# Patient Record
Sex: Male | Born: 1990 | Race: White | Hispanic: No | Marital: Single | Smoking: Former smoker
Health system: Southern US, Community
[De-identification: ages and names within clinical notes are randomized; demographics above are authoritative.]

## PROBLEM LIST (undated history)

## (undated) DIAGNOSIS — Z21 Asymptomatic human immunodeficiency virus [HIV] infection status: Secondary | ICD-10-CM

## (undated) DIAGNOSIS — I10 Essential (primary) hypertension: Secondary | ICD-10-CM

## (undated) DIAGNOSIS — B20 Human immunodeficiency virus [HIV] disease: Secondary | ICD-10-CM

## (undated) DIAGNOSIS — F419 Anxiety disorder, unspecified: Secondary | ICD-10-CM

## (undated) DIAGNOSIS — F32A Depression, unspecified: Secondary | ICD-10-CM

## (undated) HISTORY — PX: INNER EAR SURGERY: SHX679

---
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
## 2010-04-24 LAB — URINALYSIS, ROUTINE W REFLEX MICROSCOPIC
Glucose, UA: NEGATIVE mg/dL
Hgb urine dipstick: NEGATIVE
Protein, ur: NEGATIVE mg/dL
Urobilinogen, UA: 1 mg/dL (ref 0.0–1.0)
pH: 6 (ref 5.0–8.0)

## 2014-04-16 ENCOUNTER — Emergency Department (HOSPITAL_COMMUNITY)
Admission: EM | Admit: 2014-04-16 | Discharge: 2014-04-16 | Disposition: A | Discharge status: Home / Self Care | Payer: Worker's Compensation | Attending: Emergency Medicine | Admitting: Emergency Medicine

## 2014-04-16 ENCOUNTER — Encounter (HOSPITAL_COMMUNITY): Payer: Self-pay

## 2014-04-16 DIAGNOSIS — Z720 Tobacco use: Secondary | ICD-10-CM | POA: Insufficient documentation

## 2014-04-16 DIAGNOSIS — Z4801 Encounter for change or removal of surgical wound dressing: Secondary | ICD-10-CM | POA: Diagnosis not present

## 2014-04-16 DIAGNOSIS — Z5189 Encounter for other specified aftercare: Secondary | ICD-10-CM
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
## 2014-04-16 NOTE — Discharge Instructions (Signed)
Your wound appears healthy at this time.  Continue using the silvadene twice daily after washing with mild soap and water.  Keep covered.  Followup with Dr. Theresia LoKingsley at [facility] as planned in 2 days for another recheck of your injury.
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
## 2015-04-10 DIAGNOSIS — S0083XA Contusion of other part of head, initial encounter: Secondary | ICD-10-CM | POA: Diagnosis not present

## 2015-04-10 DIAGNOSIS — F17200 Nicotine dependence, unspecified, uncomplicated: Secondary | ICD-10-CM | POA: Insufficient documentation

## 2015-04-10 DIAGNOSIS — S30810A Abrasion of lower back and pelvis, initial encounter: Secondary | ICD-10-CM | POA: Insufficient documentation

## 2015-04-10 DIAGNOSIS — S40819A Abrasion of unspecified upper arm, initial encounter: Secondary | ICD-10-CM | POA: Diagnosis not present

## 2015-04-10 DIAGNOSIS — Y999 Unspecified external cause status: Secondary | ICD-10-CM | POA: Insufficient documentation

## 2015-04-10 DIAGNOSIS — Y929 Unspecified place or not applicable: Secondary | ICD-10-CM | POA: Insufficient documentation

## 2015-04-10 DIAGNOSIS — Y939 Activity, unspecified: Secondary | ICD-10-CM | POA: Diagnosis not present

## 2015-04-10 DIAGNOSIS — S0990XA Unspecified injury of head, initial encounter: Secondary | ICD-10-CM | POA: Diagnosis present

## 2015-04-11 ENCOUNTER — Emergency Department (HOSPITAL_COMMUNITY)
Admission: EM | Admit: 2015-04-11 | Discharge: 2015-04-11 | Disposition: A | Discharge status: Home / Self Care | Payer: BLUE CROSS/BLUE SHIELD | Attending: Emergency Medicine | Admitting: Emergency Medicine

## 2015-04-11 ENCOUNTER — Encounter (HOSPITAL_COMMUNITY): Payer: Self-pay

## 2015-04-11 DIAGNOSIS — T07XXXA Unspecified multiple injuries, initial encounter: Secondary | ICD-10-CM

## 2015-04-11 DIAGNOSIS — S0990XA Unspecified injury of head, initial encounter: Secondary | ICD-10-CM

## 2015-04-11 MED ORDER — ONDANSETRON 4 MG PO TBDP: 4.0000 mg | ORAL_TABLET | Freq: Three times a day (TID) | ORAL | Status: DC | PRN

## 2015-04-11 MED ORDER — IBUPROFEN 800 MG PO TABS
800.0000 mg | ORAL_TABLET | Freq: Once | ORAL | Status: AC
  Administered 2015-04-11: 800 mg via ORAL
  Filled 2015-04-11: qty 1

## 2015-04-11 MED ORDER — ONDANSETRON 4 MG PO TBDP
4.0000 mg | ORAL_TABLET | Freq: Once | ORAL | Status: AC
  Administered 2015-04-11: 4 mg via ORAL
  Filled 2015-04-11: qty 1

## 2015-04-11 MED ORDER — IBUPROFEN 800 MG PO TABS: 800.0000 mg | ORAL_TABLET | Freq: Three times a day (TID) | ORAL | Status: DC | PRN
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
## 2015-04-11 NOTE — Discharge Instructions (Signed)
Concussion, Adult  A concussion, or closed-head injury, is a brain injury caused by a direct blow to the head or by a quick and sudden movement (jolt) of the head or neck. Concussions are usually not life-threatening. Even so, the effects of a concussion can be serious. If you have had a concussion before, you are more likely to experience concussion-like symptoms after a direct blow to the head.   CAUSES  · Direct blow to the head, such as from running into another player during a soccer game, being hit in a fight, or hitting your head on a hard surface.  · A jolt of the head or neck that causes the brain to move back and forth inside the skull, such as in a car crash.  SIGNS AND SYMPTOMS  The signs of a concussion can be hard to notice. Early on, they may be missed by you, family members, and health care providers. You may look fine but act or feel differently.  Symptoms are usually temporary, but they may last for days, weeks, or even longer. Some symptoms may appear right away while others may not show up for hours or days. Every head injury is different. Symptoms include:  · Mild to moderate headaches that will not go away.  · A feeling of pressure inside your head.  · Having more trouble than usual:    Learning or remembering things you have heard.    Answering questions.    Paying attention or concentrating.    Organizing daily tasks.    Making decisions and solving problems.  · Slowness in thinking, acting or reacting, speaking, or reading.  · Getting lost or being easily confused.  · Feeling tired all the time or lacking energy (fatigued).  · Feeling drowsy.  · Sleep disturbances.    Sleeping more than usual.    Sleeping less than usual.    Trouble falling asleep.    Trouble sleeping (insomnia).  · Loss of balance or feeling lightheaded or dizzy.  · Nausea or vomiting.  · Numbness or tingling.  · Increased sensitivity to:    Sounds.    Lights.    Distractions.  · Vision problems or eyes that tire  easily.  · Diminished sense of taste or smell.  · Ringing in the ears.  · Mood changes such as feeling sad or anxious.  · Becoming easily irritated or angry for little or no reason.  · Lack of motivation.  · Seeing or hearing things other people do not see or hear (hallucinations).  DIAGNOSIS  Your health care provider can usually diagnose a concussion based on a description of your injury and symptoms. He or she will ask whether you passed out (lost consciousness) and whether you are having trouble remembering events that happened right before and during your injury.  Your evaluation might include:  · A brain scan to look for signs of injury to the brain. Even if the test shows no injury, you may still have a concussion.  · Blood tests to be sure other problems are not present.  TREATMENT  · Concussions are usually treated in an emergency department, in urgent care, or at a clinic. You may need to stay in the hospital overnight for further treatment.  · Tell your health care provider if you are taking any medicines, including prescription medicines, over-the-counter medicines, and natural remedies. Some medicines, such as blood thinners (anticoagulants) and aspirin, may increase the chance of complications. Also tell your health care   provider whether you have had alcohol or are taking illegal drugs. This information may affect treatment.  · Your health care provider will send you home with important instructions to follow.  · How fast you will recover from a concussion depends on many factors. These factors include how severe your concussion is, what part of your brain was injured, your age, and how healthy you were before the concussion.  · Most people with mild injuries recover fully. Recovery can take time. In general, recovery is slower in older persons. Also, persons who have had a concussion in the past or have other medical problems may find that it takes longer to recover from their current injury.  HOME  CARE INSTRUCTIONS  General Instructions  · Carefully follow the directions your health care provider gave you.  · Only take over-the-counter or prescription medicines for pain, discomfort, or fever as directed by your health care provider.  · Take only those medicines that your health care provider has approved.  · Do not drink alcohol until your health care provider says you are well enough to do so. Alcohol and certain other drugs may slow your recovery and can put you at risk of further injury.  · If it is harder than usual to remember things, write them down.  · If you are easily distracted, try to do one thing at a time. For example, do not try to watch TV while fixing dinner.  · Talk with family members or close friends when making important decisions.  · Keep all follow-up appointments. Repeated evaluation of your symptoms is recommended for your recovery.  · Watch your symptoms and tell others to do the same. Complications sometimes occur after a concussion. Older adults with a brain injury may have a higher risk of serious complications, such as a blood clot on the brain.  · Tell your teachers, school nurse, school counselor, coach, athletic trainer, or work manager about your injury, symptoms, and restrictions. Tell them about what you can or cannot do. They should watch for:    Increased problems with attention or concentration.    Increased difficulty remembering or learning new information.    Increased time needed to complete tasks or assignments.    Increased irritability or decreased ability to cope with stress.    Increased symptoms.  · Rest. Rest helps the brain to heal. Make sure you:    Get plenty of sleep at night. Avoid staying up late at night.    Keep the same bedtime hours on weekends and weekdays.    Rest during the day. Take daytime naps or rest breaks when you feel tired.  · Limit activities that require a lot of thought or concentration. These include:    Doing homework or job-related  work.    Watching TV.    Working on the computer.  · Avoid any situation where there is potential for another head injury (football, hockey, soccer, basketball, martial arts, downhill snow sports and horseback riding). Your condition will get worse every time you experience a concussion. You should avoid these activities until you are evaluated by the appropriate follow-up health care providers.  Returning To Your Regular Activities  You will need to return to your normal activities slowly, not all at once. You must give your body and brain enough time for recovery.  · Do not return to sports or other athletic activities until your health care provider tells you it is safe to do so.  · Ask   your health care provider when you can drive, ride a bicycle, or operate heavy machinery. Your ability to react may be slower after a brain injury. Never do these activities if you are dizzy.  · Ask your health care provider about when you can return to work or school.  Preventing Another Concussion  It is very important to avoid another brain injury, especially before you have recovered. In rare cases, another injury can lead to permanent brain damage, brain swelling, or death. The risk of this is greatest during the first 7-10 days after a head injury. Avoid injuries by:  · Wearing a seat belt when riding in a car.  · Drinking alcohol only in moderation.  · Wearing a helmet when biking, skiing, skateboarding, skating, or doing similar activities.  · Avoiding activities that could lead to a second concussion, such as contact or recreational sports, until your health care provider says it is okay.  · Taking safety measures in your home.    Remove clutter and tripping hazards from floors and stairways.    Use grab bars in bathrooms and handrails by stairs.    Place non-slip mats on floors and in bathtubs.    Improve lighting in dim areas.  SEEK MEDICAL CARE IF:  · You have increased problems paying attention or  concentrating.  · You have increased difficulty remembering or learning new information.  · You need more time to complete tasks or assignments than before.  · You have increased irritability or decreased ability to cope with stress.  · You have more symptoms than before.  Seek medical care if you have any of the following symptoms for more than 2 weeks after your injury:  · Lasting (chronic) headaches.  · Dizziness or balance problems.  · Nausea.  · Vision problems.  · Increased sensitivity to noise or light.  · Depression or mood swings.  · Anxiety or irritability.  · Memory problems.  · Difficulty concentrating or paying attention.  · Sleep problems.  · Feeling tired all the time.  SEEK IMMEDIATE MEDICAL CARE IF:  · You have severe or worsening headaches. These may be a sign of a blood clot in the brain.  · You have weakness (even if only in one hand, leg, or part of the face).  · You have numbness.  · You have decreased coordination.  · You vomit repeatedly.  · You have increased sleepiness.  · One pupil is larger than the other.  · You have convulsions.  · You have slurred speech.  · You have increased confusion. This may be a sign of a blood clot in the brain.  · You have increased restlessness, agitation, or irritability.  · You are unable to recognize people or places.  · You have neck pain.  · It is difficult to wake you up.  · You have unusual behavior changes.  · You lose consciousness.  MAKE SURE YOU:  · Understand these instructions.  · Will watch your condition.  · Will get help right away if you are not doing well or get worse.     This information is not intended to replace advice given to you by your health care provider. Make sure you discuss any questions you have with your health care provider.     Document Released: 03/27/2003 Document Revised: 01/25/2014 Document Reviewed: 07/27/2012  Elsevier Interactive Patient Education ©2016 [employer] Inc[employer]

## 2015-07-16 ENCOUNTER — Emergency Department (HOSPITAL_COMMUNITY)
Admission: EM | Admit: 2015-07-16 | Discharge: 2015-07-16 | Disposition: A | Discharge status: Home / Self Care | Payer: BLUE CROSS/BLUE SHIELD | Attending: Emergency Medicine | Admitting: Emergency Medicine

## 2015-07-16 ENCOUNTER — Encounter (HOSPITAL_COMMUNITY): Payer: Self-pay | Admitting: Emergency Medicine

## 2015-07-16 DIAGNOSIS — W57XXXA Bitten or stung by nonvenomous insect and other nonvenomous arthropods, initial encounter: Secondary | ICD-10-CM | POA: Insufficient documentation

## 2015-07-16 DIAGNOSIS — Y929 Unspecified place or not applicable: Secondary | ICD-10-CM | POA: Insufficient documentation

## 2015-07-16 DIAGNOSIS — S50361A Insect bite (nonvenomous) of right elbow, initial encounter: Secondary | ICD-10-CM | POA: Insufficient documentation

## 2015-07-16 DIAGNOSIS — Y939 Activity, unspecified: Secondary | ICD-10-CM | POA: Insufficient documentation

## 2015-07-16 DIAGNOSIS — Y999 Unspecified external cause status: Secondary | ICD-10-CM | POA: Insufficient documentation

## 2015-07-16 DIAGNOSIS — F17200 Nicotine dependence, unspecified, uncomplicated: Secondary | ICD-10-CM | POA: Insufficient documentation

## 2015-07-16 MED ORDER — DOXYCYCLINE HYCLATE 100 MG PO TABS
100.0000 mg | ORAL_TABLET | Freq: Once | ORAL | Status: AC
  Administered 2015-07-16: 100 mg via ORAL
  Filled 2015-07-16: qty 1

## 2015-07-16 MED ORDER — DEXAMETHASONE SODIUM PHOSPHATE 4 MG/ML IJ SOLN
8.0000 mg | Freq: Once | INTRAMUSCULAR | Status: AC
  Administered 2015-07-16: 8 mg via INTRAMUSCULAR
  Filled 2015-07-16: qty 2

## 2015-07-16 MED ORDER — IBUPROFEN 600 MG PO TABS: 600.0000 mg | ORAL_TABLET | Freq: Four times a day (QID) | ORAL | Status: DC | PRN

## 2015-07-16 MED ORDER — DOXYCYCLINE HYCLATE 100 MG PO CAPS: 100.0000 mg | ORAL_CAPSULE | Freq: Two times a day (BID) | ORAL | Status: DC

## 2015-07-16 MED ORDER — IBUPROFEN 800 MG PO TABS
800.0000 mg | ORAL_TABLET | Freq: Once | ORAL | Status: AC
  Administered 2015-07-16: 800 mg via ORAL
  Filled 2015-07-16: qty 1

## 2015-07-16 MED ORDER — ONDANSETRON HCL 4 MG PO TABS
4.0000 mg | ORAL_TABLET | Freq: Once | ORAL | Status: AC
  Administered 2015-07-16: 4 mg via ORAL
  Filled 2015-07-16: qty 1

## 2015-07-16 MED ORDER — AMOXICILLIN-POT CLAVULANATE 875-125 MG PO TABS
1.0000 | ORAL_TABLET | Freq: Once | ORAL | Status: AC
  Administered 2015-07-16: 1 via ORAL
  Filled 2015-07-16: qty 1

## 2015-07-16 MED ORDER — DEXAMETHASONE 4 MG PO TABS: 4.0000 mg | ORAL_TABLET | Freq: Two times a day (BID) | ORAL | Status: DC

## 2015-07-16 NOTE — ED Notes (Signed)
Pt c/o right elbow redness, swelling and pain.

## 2015-07-16 NOTE — Discharge Instructions (Signed)
Cellulitis PLEASE USE WARM TUB SOAKS DAILY FOR 15-20 MIN. APPLY BANDAGE TO THE INFECTED SITE. USE MEDICATION AS SUGGESTED. USE NORCO FOR SEVERE PAIN. This medication may cause drowsiness. Please do not drink, drive, or participate in activity that requires concentration while taking this medication. hAVE YOU ELBOW RECHECKED IN 3 DAYS BY YOUR PRIMARY MD, OR RETURN TO THE ED. Cellulitis is an infection of the skin and the tissue under the skin. The infected area is usually red and tender. This happens most often in the arms and lower legs. HOME CARE   Take your antibiotic medicine as told. Finish the medicine even if you start to feel better.  Keep the infected arm or leg raised (elevated).  Put a warm cloth on the area up to 4 times per day.  Only take medicines as told by your doctor.  Keep all doctor visits as told. GET HELP IF:  You see red streaks on the skin coming from the infected area.  Your red area gets bigger or turns a dark color.  Your bone or joint under the infected area is painful after the skin heals.  Your infection comes back in the same area or different area.  You have a puffy (swollen) bump in the infected area.  You have new symptoms.  You have a fever. GET HELP RIGHT AWAY IF:   You feel very sleepy.  You throw up (vomit) or have watery poop (diarrhea).  You feel sick and have muscle aches and pains.   This information is not intended to replace advice given to you by your health care provider. Make sure you discuss any questions you have with your health care provider.   Document Released: 06/23/2007 Document Revised: 09/25/2014 Document Reviewed: 03/22/2011 Elsevier Interactive Patient Education [employer]2016 [employer]

## 2015-07-16 NOTE — ED Provider Notes (Signed)
CSN: [id number]     Arrival date & time 07/16/15  0020 History   First MD Initiated Contact with Patient 07/16/15 0025     No chief complaint on file.    (Consider location/radiation/quality/duration/timing/severity/associated sxs/prior Treatment) HPI Comments: Patient states that 3 days ago he noticed a red raised area on the right elbow on. He did not appear a lot of attention to it, however the next day it was more swollen and nontender, that night there was a blister in the center of the increased redness. He popped the area. Today he notices increasing tenderness and swelling and redness on. He has not had temperature elevation, nausea, or vomiting, but just increasing tenderness and redness particular while he was at work. Patient states he thinks he may have been bitten by a spider. He denies any medical conditions that would impact his immune system.  The history is provided by the patient.    No past medical history on file. No past surgical history on file. No family history on file. Social History  Substance Use Topics  . Smoking status: Current Every Day Smoker  . Smokeless tobacco: Not on file  . Alcohol Use: No    Review of Systems  Constitutional: Negative for activity change.       All ROS Neg except as noted in HPI  HENT: Negative for nosebleeds.   Eyes: Negative for photophobia and discharge.  Respiratory: Negative for cough, shortness of breath and wheezing.   Cardiovascular: Negative for chest pain and palpitations.  Gastrointestinal: Negative for abdominal pain and blood in stool.  Genitourinary: Negative for dysuria, frequency and hematuria.  Musculoskeletal: Negative for back pain, arthralgias and neck pain.  Skin: Negative.   Neurological: Negative for dizziness, seizures and speech difficulty.  Psychiatric/Behavioral: Negative for hallucinations and confusion.      Allergies  Review of patient's allergies indicates no known allergies.  Home  Medications   Prior to Admission medications   Medication Sig Start Date End Date Taking? Authorizing Provider  ibuprofen (ADVIL,MOTRIN) 800 MG tablet Take 1 tablet (800 mg total) by mouth every 8 (eight) hours as needed for mild pain. 04/11/15   Kristen N Ward, DO  ondansetron (ZOFRAN ODT) 4 MG disintegrating tablet Take 1 tablet (4 mg total) by mouth every 8 (eight) hours as needed for nausea or vomiting. 04/11/15   Kristen N Ward, DO   There were no vitals taken for this visit. Physical Exam  Constitutional: He is oriented to person, place, and time. He appears well-developed and well-nourished.  Non-toxic appearance.  HENT:  Head: Normocephalic.  Right Ear: Tympanic membrane and external ear normal.  Left Ear: Tympanic membrane and external ear normal.  Eyes: EOM and lids are normal. Pupils are equal, round, and reactive to light.  Neck: Normal range of motion. Neck supple. Carotid bruit is not present.  Cardiovascular: Normal rate, regular rhythm, normal heart sounds, intact distal pulses and normal pulses.   Pulmonary/Chest: Breath sounds normal. No respiratory distress.  Abdominal: Soft. Bowel sounds are normal. There is no tenderness. There is no guarding.  Musculoskeletal: Normal range of motion.  There is a scabbed area in the center of area of increased redness at the right elbow. There is some redness extending at the base of the tricep area. There is mild increased warmth present. There is no red streaks appreciated. There is full range of motion of the fingers, wrist, elbow, and shoulder. Radial pulses 2+.  Lymphadenopathy:  Head (right side): No submandibular adenopathy present.       Head (left side): No submandibular adenopathy present.    He has no cervical adenopathy.  Neurological: He is alert and oriented to person, place, and time. He has normal strength. No cranial nerve deficit or sensory deficit.  Skin: Skin is warm and dry.  Psychiatric: He has a normal mood  and affect. His speech is normal.  Nursing note and vitals reviewed.   ED Course  Procedures (including critical care time) Labs Review Labs Reviewed - No data to display  Imaging Review No results found. I have personally reviewed and evaluated these images and lab results as part of my medical decision-making.   EKG Interpretation None      MDM  Vital signs are well within normal limits. Pulse oximetry is 100% on room air. The examination favors an infected insect bite. The patient will be treated with doxycycline and Decadron. The patient is to use warm tub soaks daily. He will return to the emergency department if not improving. Pt in agreement with plan.   Final diagnoses:  None    **I have reviewed nursing notes, vital signs, and all appropriate lab and imaging results for this patient.Ivery Quale*    Tayvia Faughnan, PA-C 07/17/15 1123  Ivery QualeHobson Adonay Scheier, PA-C 08/02/15 [id number]  Devoria AlbeIva Knapp, MD 08/06/15 2256

## 2015-11-07 ENCOUNTER — Emergency Department (HOSPITAL_COMMUNITY): Payer: No Typology Code available for payment source

## 2015-11-07 ENCOUNTER — Emergency Department (HOSPITAL_COMMUNITY)
Admission: EM | Admit: 2015-11-07 | Discharge: 2015-11-07 | Disposition: A | Discharge status: Home / Self Care | Payer: No Typology Code available for payment source | Attending: Emergency Medicine | Admitting: Emergency Medicine

## 2015-11-07 ENCOUNTER — Encounter (HOSPITAL_COMMUNITY): Payer: Self-pay | Admitting: Emergency Medicine

## 2015-11-07 DIAGNOSIS — S0081XA Abrasion of other part of head, initial encounter: Secondary | ICD-10-CM | POA: Diagnosis not present

## 2015-11-07 DIAGNOSIS — S93602A Unspecified sprain of left foot, initial encounter: Secondary | ICD-10-CM | POA: Diagnosis not present

## 2015-11-07 DIAGNOSIS — F17210 Nicotine dependence, cigarettes, uncomplicated: Secondary | ICD-10-CM | POA: Diagnosis not present

## 2015-11-07 DIAGNOSIS — R1084 Generalized abdominal pain: Secondary | ICD-10-CM | POA: Diagnosis not present

## 2015-11-07 DIAGNOSIS — R079 Chest pain, unspecified: Secondary | ICD-10-CM | POA: Insufficient documentation

## 2015-11-07 DIAGNOSIS — R51 Headache: Secondary | ICD-10-CM | POA: Insufficient documentation

## 2015-11-07 DIAGNOSIS — Y9389 Activity, other specified: Secondary | ICD-10-CM | POA: Diagnosis not present

## 2015-11-07 DIAGNOSIS — S8002XA Contusion of left knee, initial encounter: Secondary | ICD-10-CM

## 2015-11-07 DIAGNOSIS — Y92410 Unspecified street and highway as the place of occurrence of the external cause: Secondary | ICD-10-CM | POA: Insufficient documentation

## 2015-11-07 DIAGNOSIS — S8001XA Contusion of right knee, initial encounter: Secondary | ICD-10-CM | POA: Diagnosis not present

## 2015-11-07 DIAGNOSIS — Y999 Unspecified external cause status: Secondary | ICD-10-CM | POA: Insufficient documentation

## 2015-11-07 DIAGNOSIS — S99922A Unspecified injury of left foot, initial encounter: Secondary | ICD-10-CM | POA: Diagnosis present

## 2015-11-07 DIAGNOSIS — Z791 Long term (current) use of non-steroidal anti-inflammatories (NSAID): Secondary | ICD-10-CM | POA: Insufficient documentation

## 2015-11-07 LAB — CBC
HEMATOCRIT: 46.2 % (ref 39.0–52.0)
Hemoglobin: 16.5 g/dL (ref 13.0–17.0)
MCH: 31.4 pg (ref 26.0–34.0)
MCHC: 35.7 g/dL (ref 30.0–36.0)
MCV: 87.8 fL (ref 78.0–100.0)
Platelets: 165 10*3/uL (ref 150–400)
RBC: 5.26 MIL/uL (ref 4.22–5.81)
RDW: 12.4 % (ref 11.5–15.5)
WBC: 9.9 10*3/uL (ref 4.0–10.5)

## 2015-11-07 LAB — BASIC METABOLIC PANEL
Anion gap: 4 — ABNORMAL LOW (ref 5–15)
BUN: 10 mg/dL (ref 6–20)
CHLORIDE: 105 mmol/L (ref 101–111)
CO2: 26 mmol/L (ref 22–32)
CREATININE: 0.72 mg/dL (ref 0.61–1.24)
Calcium: 9 mg/dL (ref 8.9–10.3)
GFR calc Af Amer: >60 mL/min (ref >60)
GFR calc non Af Amer: >60 mL/min (ref >60)
GLUCOSE: 100 mg/dL — AB (ref 65–99)
POTASSIUM: 3.6 mmol/L (ref 3.5–5.1)
Sodium: 135 mmol/L (ref 135–145)

## 2015-11-07 MED ORDER — NAPROXEN 500 MG PO TABS: 500.0000 mg | ORAL_TABLET | Freq: Two times a day (BID) | ORAL | 0 refills | Status: DC

## 2015-11-07 MED ORDER — HYDROCODONE-ACETAMINOPHEN 5-325 MG PO TABS: 1.0000 | ORAL_TABLET | Freq: Four times a day (QID) | ORAL | 0 refills | Status: DC | PRN

## 2015-11-07 MED ORDER — SODIUM CHLORIDE 0.9 % IV BOLUS (SEPSIS)
500.0000 mL | Freq: Once | INTRAVENOUS | Status: AC
  Administered 2015-11-07: 500 mL via INTRAVENOUS

## 2015-11-07 MED ORDER — SODIUM CHLORIDE 0.9 % IV SOLN
INTRAVENOUS | Status: DC
  Administered 2015-11-07: 15:00:00 via INTRAVENOUS

## 2015-11-07 MED ORDER — ONDANSETRON HCL 4 MG/2ML IJ SOLN
4.0000 mg | Freq: Once | INTRAMUSCULAR | Status: AC
  Administered 2015-11-07: 4 mg via INTRAVENOUS
  Filled 2015-11-07: qty 2

## 2015-11-07 MED ORDER — IOPAMIDOL (ISOVUE-300) INJECTION 61%
100.0000 mL | Freq: Once | INTRAVENOUS | Status: AC | PRN
  Administered 2015-11-07: 100 mL via INTRAVENOUS

## 2015-11-07 MED ORDER — FENTANYL CITRATE (PF) 100 MCG/2ML IJ SOLN
50.0000 ug | Freq: Once | INTRAMUSCULAR | Status: DC
  Filled 2015-11-07: qty 2
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
## 2015-11-07 NOTE — Discharge Instructions (Signed)
Expect to be sore and stiff for the next 2 days. Take the Naprosyn on a regular basis for the next 7 days. Supplement with hydrocodone as needed. Work note provided. If the left foot ankle was not improving over the 5 days and follow-up either here or with orthopedics. Referral information provided.
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
## 2016-09-22 ENCOUNTER — Ambulatory Visit (INDEPENDENT_AMBULATORY_CARE_PROVIDER_SITE_OTHER): Payer: Self-pay | Admitting: Internal Medicine

## 2016-09-22 ENCOUNTER — Encounter: Payer: Self-pay | Admitting: Internal Medicine

## 2016-09-22 DIAGNOSIS — Z113 Encounter for screening for infections with a predominantly sexual mode of transmission: Secondary | ICD-10-CM | POA: Insufficient documentation

## 2016-09-22 DIAGNOSIS — B20 Human immunodeficiency virus [HIV] disease: Secondary | ICD-10-CM

## 2016-09-22 DIAGNOSIS — Z79899 Other long term (current) drug therapy: Secondary | ICD-10-CM

## 2016-09-23 ENCOUNTER — Other Ambulatory Visit (INDEPENDENT_AMBULATORY_CARE_PROVIDER_SITE_OTHER): Payer: Self-pay

## 2016-09-23 ENCOUNTER — Ambulatory Visit (INDEPENDENT_AMBULATORY_CARE_PROVIDER_SITE_OTHER): Payer: Self-pay | Admitting: *Deleted

## 2016-09-23 DIAGNOSIS — B20 Human immunodeficiency virus [HIV] disease: Secondary | ICD-10-CM

## 2016-09-23 DIAGNOSIS — Z113 Encounter for screening for infections with a predominantly sexual mode of transmission: Secondary | ICD-10-CM

## 2016-09-23 DIAGNOSIS — Z23 Encounter for immunization: Secondary | ICD-10-CM

## 2016-09-23 NOTE — Progress Notes (Signed)
Patient ID: Ralph ErieWilliam A Brandner, male    DOB: [birth year], 26 y.o.   MRN: [id number]  Reason for visit: to establish care as a new patient with HIV  HPI:   Patient was first diagnosed in July during screening [facility] the [facility].  He was tested as part routine screen.  Previously never tested.  No other labs done at this time. There have been no associated symptoms. No weight loss, no diarrhea.  Here with his mother.   No history of GC, chlamydia, syphilis.  +MSM.  PMH: HIV  Prior to Admission medications   Not on File    No Known Allergies  Social History  Substance Use Topics  . Smoking status: Current Every Day Smoker    Packs/day: 0.50    Types: Cigarettes  . Smokeless tobacco: Never Used  . Alcohol use Yes     Comment: occassional    FH: DM and autoimmune hepatitis in mother   Review of Systems Constitutional: negative for fatigue and malaise Gastrointestinal: negative for diarrhea Integument/breast: negative for rash All other systems reviewed and are negative    CONSTITUTIONAL:in no apparent distress  Height 5\' 8"  (1.727 m), weight 180 lb (81.6 kg). EYES: anicteric HENT: no thrush CARD:Cor RRR RESP:CTA B; normal respitory effort MV:HQIONGI:Bowel sounds are normal, liver is not enlarged, spleen is not enlarged MS:no pedal edema noted SKIN:no rashes NEURO: non-focal  No results found for: HIV1RNAQUANT No components found for: HIV1GENOTYPRPLUS No components found for: THELPERCELL  Assessment: new patient here with HIV.  Discussed with patient treatment options and side effects, benefits of treatment, long term outcomes.  I discussed the severity of untreated HIV including higher cancer risk, opportunistic infections, renal failure.  Also discussed needing to use condoms, partner disclosure, necessary vaccines, blood monitoring.  All questions answered.    Plan: 1) Labs today 2) rtc 1 weeks to discuss results, start medication

## 2016-09-24 ENCOUNTER — Encounter: Payer: Self-pay | Admitting: Internal Medicine

## 2016-09-24 LAB — URINE CYTOLOGY ANCILLARY ONLY
CHLAMYDIA, DNA PROBE: NEGATIVE
NEISSERIA GONORRHEA: NEGATIVE

## 2016-09-24 LAB — T-HELPER CELL (CD4) - (RCID CLINIC ONLY)
CD4 % Helper T Cell: 17 % — ABNORMAL LOW (ref 33–55)
CD4 T Cell Abs: 290 /uL — ABNORMAL LOW (ref 400–2700)

## 2016-09-29 ENCOUNTER — Ambulatory Visit (INDEPENDENT_AMBULATORY_CARE_PROVIDER_SITE_OTHER): Payer: Self-pay | Admitting: Internal Medicine

## 2016-09-29 ENCOUNTER — Encounter: Payer: Self-pay | Admitting: Internal Medicine

## 2016-09-29 VITALS — BP 136/90 | HR 82 | Temp 97.9°F | Ht 68.0 in | Wt 176.0 lb

## 2016-09-29 DIAGNOSIS — Z7185 Encounter for immunization safety counseling: Secondary | ICD-10-CM

## 2016-09-29 DIAGNOSIS — Z113 Encounter for screening for infections with a predominantly sexual mode of transmission: Secondary | ICD-10-CM

## 2016-09-29 DIAGNOSIS — B20 Human immunodeficiency virus [HIV] disease: Secondary | ICD-10-CM

## 2016-09-29 DIAGNOSIS — Z23 Encounter for immunization: Secondary | ICD-10-CM

## 2016-09-29 DIAGNOSIS — Z7189 Other specified counseling: Secondary | ICD-10-CM

## 2016-09-29 LAB — COMPLETE METABOLIC PANEL WITH GFR
AG RATIO: 1.6 (calc) (ref 1.0–2.5)
ALT: 18 U/L (ref 9–46)
AST: 19 U/L (ref 10–40)
Albumin: 4.5 g/dL (ref 3.6–5.1)
Alkaline phosphatase (APISO): 86 U/L (ref 40–115)
BUN: 9 mg/dL (ref 7–25)
CALCIUM: 9.5 mg/dL (ref 8.6–10.3)
CO2: 25 mmol/L (ref 20–32)
Chloride: 105 mmol/L (ref 98–110)
Creat: 0.89 mg/dL (ref 0.60–1.35)
GFR, EST NON AFRICAN AMERICAN: 118 mL/min/{1.73_m2} (ref >=60)
GFR, Est African American: 137 mL/min/{1.73_m2} (ref >=60)
GLOBULIN: 2.9 g/dL (ref 1.9–3.7)
Glucose, Bld: 110 mg/dL — ABNORMAL HIGH (ref 65–99)
POTASSIUM: 4 mmol/L (ref 3.5–5.3)
SODIUM: 136 mmol/L (ref 135–146)
Total Bilirubin: 0.7 mg/dL (ref 0.2–1.2)
Total Protein: 7.4 g/dL (ref 6.1–8.1)

## 2016-09-29 LAB — URINALYSIS
Bilirubin Urine: NEGATIVE
Glucose, UA: NEGATIVE
HGB URINE DIPSTICK: NEGATIVE
KETONES UR: NEGATIVE
Leukocytes, UA: NEGATIVE
NITRITE: NEGATIVE
SPECIFIC GRAVITY, URINE: 1.029 (ref 1.001–1.03)
pH: 5.5 (ref 5.0–8.0)

## 2016-09-29 LAB — RPR: RPR Ser Ql: NONREACTIVE

## 2016-09-29 LAB — CBC WITH DIFFERENTIAL/PLATELET
BASOS ABS: 30 {cells}/uL (ref 0–200)
Basophils Relative: 0.6 %
EOS ABS: 100 {cells}/uL (ref 15–500)
Eosinophils Relative: 2 %
HEMATOCRIT: 47.1 % (ref 38.5–50.0)
Hemoglobin: 16.3 g/dL (ref 13.2–17.1)
LYMPHS ABS: 1435 {cells}/uL (ref 850–3900)
MCH: 30.3 pg (ref 27.0–33.0)
MCHC: 34.6 g/dL (ref 32.0–36.0)
MCV: 87.5 fL (ref 80.0–100.0)
MPV: 9.7 fL (ref 7.5–12.5)
Monocytes Relative: 8.8 %
NEUTROS PCT: 59.9 %
Neutro Abs: 2995 cells/uL (ref 1500–7800)
Platelets: 194 10*3/uL (ref 140–400)
RBC: 5.38 10*6/uL (ref 4.20–5.80)
RDW: 13.3 % (ref 11.0–15.0)
Total Lymphocyte: 28.7 %
WBC: 5 10*3/uL (ref 3.8–10.8)
WBCMIX: 440 {cells}/uL (ref 200–950)

## 2016-09-29 LAB — LIPID PANEL
CHOL/HDL RATIO: 6.1 (calc) — AB (ref <5.0)
CHOLESTEROL: 166 mg/dL (ref <200)
HDL: 27 mg/dL — AB (ref >40)
LDL CHOLESTEROL (CALC): 113 mg/dL — AB
Non-HDL Cholesterol (Calc): 139 mg/dL (calc) — ABNORMAL HIGH (ref <130)
TRIGLYCERIDES: 147 mg/dL (ref <150)

## 2016-09-29 LAB — HLA B*5701: HLA-B 5701 W/RFLX HLA-B HIGH: NEGATIVE

## 2016-09-29 LAB — QUANTIFERON TB GOLD ASSAY (BLOOD)
Mitogen-Nil: >10 IU/mL
QUANTIFERON(R)-TB GOLD: NEGATIVE
Quantiferon Nil Value: 0.05 IU/mL

## 2016-09-29 LAB — HEPATITIS A ANTIBODY, TOTAL: Hepatitis A AB,Total: NONREACTIVE

## 2016-09-29 LAB — HEPATITIS C ANTIBODY
HEP C AB: NONREACTIVE
SIGNAL TO CUT-OFF: 0.09 (ref <1.00)

## 2016-09-29 LAB — HEPATITIS B SURFACE ANTIGEN: HEP B S AG: NONREACTIVE

## 2016-09-29 LAB — HEPATITIS B CORE ANTIBODY, TOTAL: Hep B Core Total Ab: NONREACTIVE

## 2016-09-29 LAB — HEPATITIS B SURFACE ANTIBODY,QUALITATIVE: Hep B S Ab: NONREACTIVE

## 2016-09-29 MED ORDER — BICTEGRAVIR-EMTRICITAB-TENOFOV 50-200-25 MG PO TABS: 1.0000 | ORAL_TABLET | Freq: Every day | ORAL | 11 refills | Status: DC

## 2016-09-29 NOTE — Addendum Note (Signed)
Addended by: Wendall MolaOCKERHAM, Melisse Caetano A on: 09/29/2016 02:38 PM   Modules accepted: Orders

## 2016-09-29 NOTE — Progress Notes (Signed)
   Subjective:    Patient ID: Ralph ErieWilliam A Donoso, male    DOB: [birth year], 26 y.o.   MRN: [id number]  HPI  Here for follow up of HIV I saw him first last week as a new patient. Did labs then and CD4 is 290. Hepatitis A and B non-immune.  No new issues.  Has had an associated 4 lb weight loss.  No diarrhea, no n/v.  Ready to start medication.  Creat wnl.  asking about getting a job, working.     Review of Systems  Constitutional: Positive for fatigue.  Gastrointestinal: Negative for diarrhea.  Skin: Negative for rash.  Neurological: Negative for dizziness.       Objective:   Physical Exam  Constitutional: He appears well-developed and well-nourished. No distress.  HENT:  Mouth/Throat: No oropharyngeal exudate.  Eyes: No scleral icterus.  Cardiovascular: Normal rate, regular rhythm and normal heart sounds.   No murmur heard. Pulmonary/Chest: Effort normal and breath sounds normal. No respiratory distress.  Lymphadenopathy:    He has no cervical adenopathy.    SH: currently unemployed      Assessment & Plan:

## 2016-09-29 NOTE — Assessment & Plan Note (Signed)
Screened negative 

## 2016-09-29 NOTE — Assessment & Plan Note (Signed)
Counseled on hepatitis and vaccines started today.

## 2016-09-29 NOTE — Assessment & Plan Note (Signed)
Low CD4.  Will get Advancing Access to bridge until ADAP ready.  Labs in 3 weeks follow up with me in 4.

## 2016-09-30 ENCOUNTER — Ambulatory Visit: Payer: Self-pay | Admitting: Internal Medicine

## 2016-10-05 LAB — HIV-1 RNA ULTRAQUANT REFLEX TO GENTYP+
HIV 1 RNA QUANT: 14700 {copies}/mL — AB
HIV-1 RNA QUANT, LOG: 4.17 {Log_copies}/mL — AB

## 2016-10-05 LAB — HIV-1 GENOTYPE: HIV-1 GENOTYPE: DETECTED — AB

## 2016-10-21 ENCOUNTER — Other Ambulatory Visit: Payer: Self-pay

## 2016-10-21 DIAGNOSIS — B20 Human immunodeficiency virus [HIV] disease: Secondary | ICD-10-CM

## 2016-10-22 LAB — COMPLETE METABOLIC PANEL WITH GFR
AG Ratio: 1.5 (calc) (ref 1.0–2.5)
ALT: 15 U/L (ref 9–46)
AST: 17 U/L (ref 10–40)
Albumin: 4.4 g/dL (ref 3.6–5.1)
Alkaline phosphatase (APISO): 85 U/L (ref 40–115)
BUN: 7 mg/dL (ref 7–25)
CALCIUM: 9.2 mg/dL (ref 8.6–10.3)
CO2: 28 mmol/L (ref 20–32)
CREATININE: 0.93 mg/dL (ref 0.60–1.35)
Chloride: 105 mmol/L (ref 98–110)
GFR, EST NON AFRICAN AMERICAN: 112 mL/min/{1.73_m2} (ref >=60)
GFR, Est African American: 130 mL/min/{1.73_m2} (ref >=60)
Globulin: 2.9 g/dL (calc) (ref 1.9–3.7)
Glucose, Bld: 87 mg/dL (ref 65–99)
POTASSIUM: 4.2 mmol/L (ref 3.5–5.3)
Sodium: 140 mmol/L (ref 135–146)
Total Bilirubin: 0.9 mg/dL (ref 0.2–1.2)
Total Protein: 7.3 g/dL (ref 6.1–8.1)

## 2016-10-22 LAB — T-HELPER CELL (CD4) - (RCID CLINIC ONLY)
CD4 T CELL ABS: 460 /uL (ref 400–2700)
CD4 T CELL HELPER: 23 % — AB (ref 33–55)

## 2016-10-25 LAB — HIV-1 RNA QUANT-NO REFLEX-BLD
HIV 1 RNA QUANT: DETECTED {copies}/mL — AB
HIV-1 RNA QUANT, LOG: DETECTED {Log_copies}/mL — AB

## 2016-10-28 ENCOUNTER — Ambulatory Visit (INDEPENDENT_AMBULATORY_CARE_PROVIDER_SITE_OTHER): Payer: Self-pay | Admitting: Licensed Clinical Social Worker

## 2016-10-28 ENCOUNTER — Ambulatory Visit (INDEPENDENT_AMBULATORY_CARE_PROVIDER_SITE_OTHER): Payer: Self-pay | Admitting: Internal Medicine

## 2016-10-28 ENCOUNTER — Encounter: Payer: Self-pay | Admitting: Internal Medicine

## 2016-10-28 VITALS — BP 151/87 | HR 89 | Temp 98.3°F | Wt 189.1 lb

## 2016-10-28 DIAGNOSIS — Z7189 Other specified counseling: Secondary | ICD-10-CM

## 2016-10-28 DIAGNOSIS — Z23 Encounter for immunization: Secondary | ICD-10-CM

## 2016-10-28 DIAGNOSIS — Z5181 Encounter for therapeutic drug level monitoring: Secondary | ICD-10-CM | POA: Insufficient documentation

## 2016-10-28 DIAGNOSIS — B20 Human immunodeficiency virus [HIV] disease: Secondary | ICD-10-CM

## 2016-10-28 DIAGNOSIS — Z7185 Encounter for immunization safety counseling: Secondary | ICD-10-CM

## 2016-10-28 DIAGNOSIS — F411 Generalized anxiety disorder: Secondary | ICD-10-CM

## 2016-10-28 NOTE — Assessment & Plan Note (Signed)
Hepatitis B #2 today 

## 2016-10-28 NOTE — Assessment & Plan Note (Signed)
Creat, lfts ok on medictaion

## 2016-10-28 NOTE — Progress Notes (Signed)
   Subjective:    Patient ID: Ralph [name], male    DOB: 1990/09/26, 26 y.o.   MRN: [id number]  HPI Here for follow up of HIV New patient last visit and started on Biktarvy.  No new issues and tolerating well. CD4 up to 460 and virus now < 20.  Had hepatitis A and B series started.  Feels well.  Some muscle back pain that started recently but ok now.  No associated n/v/d.  No rashes.    Review of Systems  Constitutional: Negative for fatigue.  Gastrointestinal: Negative for diarrhea and nausea.  Skin: Negative for rash.       Objective:   Physical Exam  Constitutional: He appears well-developed and well-nourished. No distress.  HENT:  Mouth/Throat: No oropharyngeal exudate.  Eyes: No scleral icterus.  Cardiovascular: Normal rate, regular rhythm and normal heart sounds.   No murmur heard. Pulmonary/Chest: Effort normal and breath sounds normal. No respiratory distress.  Lymphadenopathy:    He has no cervical adenopathy.  Skin: No rash noted.   SH: + tobacco       Assessment & Plan:

## 2016-10-28 NOTE — Assessment & Plan Note (Signed)
Doing well and no issues.  rtc 3 months.

## 2016-10-28 NOTE — Addendum Note (Signed)
Addended by: Lorenso Courier on: 10/28/2016 12:34 PM   Modules accepted: Orders

## 2016-11-01 ENCOUNTER — Telehealth: Payer: Self-pay | Admitting: Licensed Clinical Social Worker

## 2016-11-01 ENCOUNTER — Ambulatory Visit: Payer: Self-pay | Admitting: Licensed Clinical Social Worker

## 2016-11-01 NOTE — Telephone Encounter (Signed)
[facility] attempted to speak with patient about rescheduling and left message asking to call back to reschedule.  Ralph [name], [facility]

## 2016-11-01 NOTE — Telephone Encounter (Signed)
Patient left voice mail stating that he could not attend appointment today due to lack of gas but wanted to reschedule.  Vergia Alberts, [facility]

## 2016-11-02 ENCOUNTER — Telehealth: Payer: Self-pay | Admitting: Licensed Clinical Social Worker

## 2016-11-02 NOTE — Telephone Encounter (Signed)
[facility] called patient and left message asking to reschedule cancelled appointment.  Vergia Alberts, [facility]

## 2016-11-02 NOTE — BH Specialist Note (Signed)
Integrated Behavioral Health Initial Visit  MRN: [id number] Name: Ralph [name]  Number of Integrated Behavioral Health Clinician visits:: 1/6 Session Start time: 11:45 am  Session End time: 12:18 pm Total time: 35 minutes  Type of Service: Integrated Behavioral Health- Individual/Family Interpretor:No. Interpretor Name and Language: N/A   Warm Hand Off Completed.       SUBJECTIVE: Ralph [name] is a 26 y.o. male accompanied by self Patient was referred by Dr. Luciana Axe due to being newly diagnosed.  Patient reports the following symptoms/concerns: Over the past two weeks mood has been "anxious" when he is around people or does too much.  Today patient self-reported "happy" mood due to receiving lab values of being undetectable.  Patient reported having history of feeling hopeless with fleeting fatigue where he feels he needs to rest.  Patient reported reduced sleep due to his "mind spinning all the time" and reported laying in bed for 5 hours before falling alseep.  Patient reported having a history of staying up all night and not going to sleep, which was last experienced 2 days ago.  Patient denied social isolation, anhedonia, appetite changes, and denied general thoughts of death and specific suicidal ideations, plan, or intent.  Patient reported that being diagnosed with HIV made him realize that he wants to live.  Patient reported procrastination and stated that he doesn't like to focus on things he needs to do because he does not like dealing with the emotions associated with it. Patient reported racing thoughts focused on the future that started 2 months ago.  Patient denied having a history of having mental health diagnosis or if taking psychotropic medications.  Patient denied using illicit street drugs.  Severity of problem: mild  OBJECTIVE: Mood: Anxious and Affect: Appropriate Risk of harm to self or others: No plan to harm self or others  LIFE CONTEXT: Family and  Social: Patient lives with his mother and her boyfriend.  Patient recently ended a relationship with his boyfriend after being together for three years. School/Work: Patient recently lost his job but is looking for employment. Self-Care: Patient is able to tend to his self-care and ADL's. Life Changes: Patient was recently diagnosed with HIV.  ASSESSMENT: Patient is currently experiencing anxiety, racing thoughts, and insomnia and may benefit from behavioral health services, medication management, and psychoeducation on good sleep hygiene.  GOALS ADDRESSED: Patient will: 1. Reduce symptoms of: anxiety, insomnia and racing thoughts 2. Increase knowledge and/or ability of: coping skills, healthy habits and good sleep hygiene  3. Demonstrate ability to: Increase healthy adjustment to current life circumstances and Utilize thought stopping techniques and distraction  INTERVENTIONS: Interventions utilized: Copywriter, advertising, Supportive Counseling and Sleep Hygiene  PLAN: 1. Follow up with behavioral health clinician on : Monday Oct 15 at 3 pm 2. Behavioral recommendations: Utilize visualization and breathing exercises taught outside of the session.    Vergia Alberts, [facility]

## 2016-11-19 ENCOUNTER — Encounter: Payer: Self-pay | Admitting: Licensed Clinical Social Worker

## 2017-02-01 ENCOUNTER — Other Ambulatory Visit: Payer: Self-pay

## 2017-02-01 ENCOUNTER — Encounter: Payer: Self-pay | Admitting: Internal Medicine

## 2017-02-01 ENCOUNTER — Ambulatory Visit (INDEPENDENT_AMBULATORY_CARE_PROVIDER_SITE_OTHER): Payer: Self-pay | Admitting: Internal Medicine

## 2017-02-01 VITALS — BP 131/88 | HR 76 | Temp 98.5°F | Ht 68.0 in | Wt 183.0 lb

## 2017-02-01 DIAGNOSIS — Z720 Tobacco use: Secondary | ICD-10-CM | POA: Insufficient documentation

## 2017-02-01 DIAGNOSIS — B20 Human immunodeficiency virus [HIV] disease: Secondary | ICD-10-CM

## 2017-02-01 NOTE — Assessment & Plan Note (Signed)
Counseled

## 2017-02-01 NOTE — Progress Notes (Signed)
   Subjective:    Patient ID: Maree ErieWilliam A Franqui, male    DOB: 04-Jul-1990, 27 y.o.   MRN: [id number]  HPI Here for follow up of HIV Started Biktarvy last year as a new patient.  No new issues and tolerating well. CD4 up to 460 and virus was < 20.  No labs prior to todays visit.  Had hepatitis A and B series started, next due in 2 months (next visit).  Feels well.  Some muscle back pain that started recently but ok now.  No associated n/v/d.  No rashes. Not sexually active.   Review of Systems  Constitutional: Negative for fatigue.  Gastrointestinal: Negative for diarrhea and nausea.  Skin: Negative for rash.       Objective:   Physical Exam  Constitutional: He appears well-developed and well-nourished. No distress.  HENT:  Mouth/Throat: No oropharyngeal exudate.  Eyes: No scleral icterus.  Cardiovascular: Normal rate, regular rhythm and normal heart sounds.  No murmur heard. Pulmonary/Chest: Effort normal and breath sounds normal. No respiratory distress.  Lymphadenopathy:    He has no cervical adenopathy.  Skin: No rash noted.   SH: + tobacco       Assessment & Plan:

## 2017-02-01 NOTE — Assessment & Plan Note (Signed)
Doing well and will check labs today and rtc 4 months unless concerns.

## 2017-02-02 LAB — T-HELPER CELL (CD4) - (RCID CLINIC ONLY)
CD4 T CELL HELPER: 26 % — AB (ref 33–55)
CD4 T Cell Abs: 530 /uL (ref 400–2700)

## 2017-02-03 LAB — HIV-1 RNA QUANT-NO REFLEX-BLD
HIV 1 RNA QUANT: NOT DETECTED {copies}/mL
HIV-1 RNA QUANT, LOG: NOT DETECTED {Log_copies}/mL

## 2017-03-11 ENCOUNTER — Encounter: Payer: Self-pay | Admitting: Internal Medicine

## 2017-06-02 ENCOUNTER — Encounter: Payer: Self-pay | Admitting: Internal Medicine

## 2017-06-02 ENCOUNTER — Ambulatory Visit (INDEPENDENT_AMBULATORY_CARE_PROVIDER_SITE_OTHER): Payer: Self-pay | Admitting: Internal Medicine

## 2017-06-02 VITALS — BP 130/90 | HR 80 | Temp 97.8°F | Ht 68.0 in | Wt 186.0 lb

## 2017-06-02 DIAGNOSIS — Z720 Tobacco use: Secondary | ICD-10-CM

## 2017-06-02 DIAGNOSIS — Z7189 Other specified counseling: Secondary | ICD-10-CM

## 2017-06-02 DIAGNOSIS — B20 Human immunodeficiency virus [HIV] disease: Secondary | ICD-10-CM

## 2017-06-02 DIAGNOSIS — Z23 Encounter for immunization: Secondary | ICD-10-CM

## 2017-06-02 DIAGNOSIS — Z7185 Encounter for immunization safety counseling: Secondary | ICD-10-CM

## 2017-06-02 DIAGNOSIS — F419 Anxiety disorder, unspecified: Secondary | ICD-10-CM | POA: Insufficient documentation

## 2017-06-02 NOTE — Assessment & Plan Note (Signed)
Counseled on quitting °

## 2017-06-02 NOTE — Progress Notes (Signed)
   Subjective:    Patient ID: Ralph [name], male    DOB: 05/16/1990, 27 y.o.   MRN: [id number]  HPI Here for follow up of HIV [facility] and thinks he missed one dose since his last visit.  No associated n/v/d.  No rashes.  Having some issues with mood and not feeling like he is enjoying things like work, which he used to.  Otherwise no complaints.     Review of Systems  Constitutional: Negative for fatigue.  Gastrointestinal: Negative for diarrhea.  Skin: Negative for rash.       Objective:   Physical Exam  Constitutional: He appears well-developed and well-nourished.  HENT:  Mouth/Throat: No oropharyngeal exudate.  Eyes: No scleral icterus.  Cardiovascular: Normal rate, regular rhythm and normal heart sounds.  No murmur heard. Pulmonary/Chest: Effort normal and breath sounds normal. No respiratory distress.  Skin: No rash noted.   SH: + tobacco       Assessment & Plan:

## 2017-06-02 NOTE — Assessment & Plan Note (Signed)
Will get final hepatitis A and B today, start PCV 13.

## 2017-06-02 NOTE — Assessment & Plan Note (Addendum)
Will talk to our counselor today and follow up with her.  No SI.

## 2017-06-02 NOTE — Assessment & Plan Note (Signed)
Doing well.  Labs today and rtc 6 months unless concerns.  

## 2017-06-03 LAB — T-HELPER CELL (CD4) - (RCID CLINIC ONLY)
CD4 % Helper T Cell: 26 % — ABNORMAL LOW (ref 33–55)
CD4 T Cell Abs: 600 /uL (ref 400–2700)

## 2017-06-05 LAB — HIV-1 RNA QUANT-NO REFLEX-BLD
HIV 1 RNA Quant: <20 copies/mL
HIV-1 RNA QUANT, LOG: NOT DETECTED {Log_copies}/mL

## 2017-07-26 ENCOUNTER — Ambulatory Visit (INDEPENDENT_AMBULATORY_CARE_PROVIDER_SITE_OTHER): Payer: Self-pay | Admitting: Licensed Clinical Social Worker

## 2017-07-26 DIAGNOSIS — F4321 Adjustment disorder with depressed mood: Secondary | ICD-10-CM

## 2017-07-26 NOTE — BH Specialist Note (Signed)
Integrated Behavioral Health Initial Visit  MRN: [id number] Name: Ralph [name]  Number of Integrated Behavioral Health Clinician visits:: 1/6 Session Start time:3:38pm  Session End time: 4:10pm Total time: 30 minutes  Type of Service: Integrated Behavioral Health- Individual/Family Interpretor:No. Interpretor Name and Language: n/a   SUBJECTIVE: Ralph [name] is a 27 y.o. male accompanied by self Patient self-referred for depressive symptoms. Patient reports the following symptoms/concerns: wanting to stay in bed all day, no motivation to get up and go to work/missing work, feeling "down and depressed", anxious around people, gets snappy easily, thoughts about dying rather than going to work, staying away from friends and letting all calls go to voicemail when he usually spends lots of time talking to friends. Duration of problem: 3 weeks; Severity of problem: moderate  OBJECTIVE: Mood: Depressed and Affect: Constricted Risk of harm to self or others: No plan to harm self or others  LIFE CONTEXT: Patient has had several life stressors lately  including being diagnosed with HIV, job changes, and the break up of a 4 year relationship. He moved back in with his mom after the breakup and is not happy about living with her at this age. 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 Although this went well at first, it has been going downhill lately and patient reports that prior to quitting his last job he was being yelled at on the phone all day and could not keep up with the changes and new systems he was required to learn. 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 He has put out 147 applications and had some interviews, but potential employers remark on the number of jobs he has had and left in a short time (4 in 2 or 3  months) and his lack of tenure at any jobs. Patient states he feels that he was taking risks to improve himself, and this is why he changed jobs so much, but employers don't seem to see that.   GOALS ADDRESSED Patient will: 1. Reduce symptoms of: depression  INTERVENTIONS: Interventions utilized: Motivational Interviewing, Brief CBT and Supportive Counseling    ASSESSMENT: Patient currently experiencing depressed mood, constricted affect, irritability, little energy, low motivation, changes in sleep cycle and quality of sleep, tendency to isolate and anxiety. He reports that friends have told him he is not his usual happy self. His symptoms are consistent with a major depressive disorder, but it is possible that the symptoms could be alleviated with adjustment to some of the major changes in his life. 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 Patient shares that he wants to feel like himself again. Counselor explored with patient what this would look like. Counselor and patient discussed how things may look a bit different from before due to some of the changes that have taken place, and counselor normalized his discomfort with change. Patient struggles to identify thought processes he has when feeling down, but agrees to begin a though log. Counselor educated patient on the cognitive triangle. Patient verbalized understanding and was able to apply the concept to some of his experiences. Counselor and patient discussed patient's goals and how to use CBT to move toward  them.     Patient may benefit from ongoing cognitive behavioral therapy.  PLAN: 1. Follow up with behavioral health clinician on : 08/02/17 at [facility]  Cassey Bacigalupo, LCSW

## 2017-08-02 ENCOUNTER — Institutional Professional Consult (permissible substitution): Payer: Self-pay | Admitting: Licensed Clinical Social Worker

## 2017-08-11 ENCOUNTER — Encounter: Payer: Self-pay | Admitting: Internal Medicine

## 2017-09-23 ENCOUNTER — Other Ambulatory Visit: Payer: Self-pay | Admitting: Internal Medicine

## 2017-12-05 ENCOUNTER — Ambulatory Visit (INDEPENDENT_AMBULATORY_CARE_PROVIDER_SITE_OTHER): Payer: Self-pay | Admitting: Internal Medicine

## 2017-12-05 ENCOUNTER — Encounter: Payer: Self-pay | Admitting: Internal Medicine

## 2017-12-05 VITALS — BP 143/90 | HR 94 | Temp 98.0°F | Ht 68.0 in | Wt 184.0 lb

## 2017-12-05 DIAGNOSIS — B20 Human immunodeficiency virus [HIV] disease: Secondary | ICD-10-CM

## 2017-12-05 DIAGNOSIS — Z5181 Encounter for therapeutic drug level monitoring: Secondary | ICD-10-CM

## 2017-12-05 DIAGNOSIS — Z23 Encounter for immunization: Secondary | ICD-10-CM

## 2017-12-05 DIAGNOSIS — Z79899 Other long term (current) drug therapy: Secondary | ICD-10-CM

## 2017-12-05 DIAGNOSIS — Z7189 Other specified counseling: Secondary | ICD-10-CM

## 2017-12-05 DIAGNOSIS — Z7185 Encounter for immunization safety counseling: Secondary | ICD-10-CM

## 2017-12-05 DIAGNOSIS — Z113 Encounter for screening for infections with a predominantly sexual mode of transmission: Secondary | ICD-10-CM

## 2017-12-05 NOTE — Progress Notes (Signed)
   Subjective:    Patient ID: Ralph [name], male    DOB: 12/22/1990, 27 y.o.   MRN: [id number]  HPI Here for follow up of HIV Takes Biktarvy and no missed doses.  No associated n/v/d.  No rashes.  No current issues with depression and mood and feels his work is going well.  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  Otherwise no complaints.     Review of Systems  Constitutional: Negative for fatigue.  Gastrointestinal: Negative for diarrhea.  Skin: Negative for rash.       Objective:   Physical Exam  Constitutional: He appears well-developed and well-nourished.  HENT:  Mouth/Throat: No oropharyngeal exudate.  Eyes: No scleral icterus.  Cardiovascular: Normal rate, regular rhythm and normal heart sounds.  No murmur heard. Pulmonary/Chest: Effort normal and breath sounds normal. No respiratory distress.  Skin: No rash noted.   SH: + tobacco       Assessment & Plan:

## 2017-12-05 NOTE — Assessment & Plan Note (Signed)
Will screen today 

## 2017-12-05 NOTE — Assessment & Plan Note (Signed)
Flu shot today 

## 2017-12-05 NOTE — Assessment & Plan Note (Signed)
Doing well.  Will check labs today to be sure no issues after missing two days.  rtc 6 months if no concerns

## 2017-12-05 NOTE — Assessment & Plan Note (Signed)
Lipid panel today

## 2017-12-05 NOTE — Assessment & Plan Note (Signed)
Will check LFTs, creat today 

## 2017-12-06 LAB — T-HELPER CELL (CD4) - (RCID CLINIC ONLY)
CD4 % Helper T Cell: 28 % — ABNORMAL LOW (ref 33–55)
CD4 T Cell Abs: 650 /uL (ref 400–2700)

## 2017-12-07 LAB — CBC WITH DIFFERENTIAL/PLATELET
BASOS ABS: 61 {cells}/uL (ref 0–200)
Basophils Relative: 0.7 %
EOS PCT: 2.4 %
Eosinophils Absolute: 209 cells/uL (ref 15–500)
HEMATOCRIT: 48.2 % (ref 38.5–50.0)
HEMOGLOBIN: 16.9 g/dL (ref 13.2–17.1)
Lymphs Abs: 2297 cells/uL (ref 850–3900)
MCH: 32.9 pg (ref 27.0–33.0)
MCHC: 35.1 g/dL (ref 32.0–36.0)
MCV: 93.8 fL (ref 80.0–100.0)
MONOS PCT: 8.4 %
MPV: 9.9 fL (ref 7.5–12.5)
NEUTROS PCT: 62.1 %
Neutro Abs: 5403 cells/uL (ref 1500–7800)
Platelets: 238 10*3/uL (ref 140–400)
RBC: 5.14 10*6/uL (ref 4.20–5.80)
RDW: 12.6 % (ref 11.0–15.0)
Total Lymphocyte: 26.4 %
WBC: 8.7 10*3/uL (ref 3.8–10.8)
WBCMIX: 731 {cells}/uL (ref 200–950)

## 2017-12-07 LAB — LIPID PANEL
CHOL/HDL RATIO: 4.8 (calc) (ref <5.0)
CHOLESTEROL: 158 mg/dL (ref <200)
HDL: 33 mg/dL — AB (ref >40)
LDL Cholesterol (Calc): 98 mg/dL (calc)
NON-HDL CHOLESTEROL (CALC): 125 mg/dL (ref <130)
TRIGLYCERIDES: 171 mg/dL — AB (ref <150)

## 2017-12-07 LAB — COMPLETE METABOLIC PANEL WITH GFR
AG Ratio: 1.7 (calc) (ref 1.0–2.5)
ALBUMIN MSPROF: 4.3 g/dL (ref 3.6–5.1)
ALT: 13 U/L (ref 9–46)
AST: 13 U/L (ref 10–40)
Alkaline phosphatase (APISO): 86 U/L (ref 40–115)
BUN: 9 mg/dL (ref 7–25)
CO2: 27 mmol/L (ref 20–32)
CREATININE: 0.94 mg/dL (ref 0.60–1.35)
Calcium: 9.6 mg/dL (ref 8.6–10.3)
Chloride: 104 mmol/L (ref 98–110)
GFR, EST AFRICAN AMERICAN: 127 mL/min/{1.73_m2} (ref >=60)
GFR, EST NON AFRICAN AMERICAN: 110 mL/min/{1.73_m2} (ref >=60)
Globulin: 2.6 g/dL (calc) (ref 1.9–3.7)
Glucose, Bld: 84 mg/dL (ref 65–99)
Potassium: 3.6 mmol/L (ref 3.5–5.3)
Sodium: 140 mmol/L (ref 135–146)
TOTAL PROTEIN: 6.9 g/dL (ref 6.1–8.1)
Total Bilirubin: 0.7 mg/dL (ref 0.2–1.2)

## 2017-12-07 LAB — HIV-1 RNA QUANT-NO REFLEX-BLD
HIV 1 RNA QUANT: 39 {copies}/mL — AB
HIV-1 RNA Quant, Log: 1.59 Log copies/mL — ABNORMAL HIGH

## 2017-12-07 LAB — RPR: RPR: NONREACTIVE

## 2017-12-21 ENCOUNTER — Other Ambulatory Visit: Payer: Self-pay | Admitting: Internal Medicine

## 2018-02-10 ENCOUNTER — Ambulatory Visit: Payer: Self-pay

## 2018-02-13 ENCOUNTER — Encounter: Payer: Self-pay | Admitting: Internal Medicine

## 2018-02-14 ENCOUNTER — Other Ambulatory Visit: Payer: Self-pay | Admitting: Internal Medicine

## 2018-06-06 ENCOUNTER — Encounter: Payer: Self-pay | Admitting: Internal Medicine

## 2018-06-06 ENCOUNTER — Ambulatory Visit: Payer: Self-pay | Admitting: Internal Medicine

## 2018-06-06 ENCOUNTER — Other Ambulatory Visit: Payer: Self-pay

## 2018-06-06 VITALS — BP 137/89 | HR 91 | Temp 98.4°F | Wt 187.0 lb

## 2018-06-06 DIAGNOSIS — B20 Human immunodeficiency virus [HIV] disease: Secondary | ICD-10-CM

## 2018-06-06 DIAGNOSIS — Z720 Tobacco use: Secondary | ICD-10-CM

## 2018-06-06 NOTE — Assessment & Plan Note (Signed)
Doing well.  Labs today and rtc 6 months.  

## 2018-06-06 NOTE — Progress Notes (Signed)
   Subjective:    Patient ID: Ralph [name], male    DOB: 1990-06-14, 28 y.o.   MRN: [id number]  HPI Here for follow up of HIV Takes Biktarvy and no missed doses.  No associated n/v/d.  No rashes.  Recently laid off from work.  No new issues.  Feels well overall.     Review of Systems  Constitutional: Negative for fatigue.  Gastrointestinal: Negative for diarrhea.  Skin: Negative for rash.       Objective:   Physical Exam Constitutional:      Appearance: He is well-developed.  Eyes:     General: No scleral icterus. Cardiovascular:     Rate and Rhythm: Normal rate and regular rhythm.     Heart sounds: Normal heart sounds. No murmur.  Pulmonary:     Effort: Pulmonary effort is normal. No respiratory distress.     Breath sounds: Normal breath sounds.  Skin:    Findings: No rash.  Neurological:     Mental Status: He is alert.    SH: + tobacco; trying to quit       Assessment & Plan:

## 2018-06-06 NOTE — Assessment & Plan Note (Signed)
Discussed cessation.  He will continue to work on it.

## 2018-06-07 LAB — T-HELPER CELL (CD4) - (RCID CLINIC ONLY)
CD4 % Helper T Cell: 35 % (ref 33–65)
CD4 T Cell Abs: 734 /uL (ref 400–1790)

## 2018-06-10 LAB — HIV-1 RNA QUANT-NO REFLEX-BLD
HIV 1 RNA Quant: <20 copies/mL
HIV-1 RNA Quant, Log: <1.3 Log copies/mL

## 2018-06-19 ENCOUNTER — Ambulatory Visit: Payer: Self-pay

## 2018-06-19 ENCOUNTER — Other Ambulatory Visit: Payer: Self-pay

## 2018-07-12 ENCOUNTER — Other Ambulatory Visit: Payer: Self-pay | Admitting: Internal Medicine

## 2018-07-19 ENCOUNTER — Ambulatory Visit: Payer: Self-pay

## 2018-08-01 ENCOUNTER — Ambulatory Visit: Payer: Self-pay

## 2018-08-01 ENCOUNTER — Other Ambulatory Visit: Payer: Self-pay

## 2018-08-14 ENCOUNTER — Encounter: Payer: Self-pay | Admitting: Internal Medicine

## 2018-12-07 ENCOUNTER — Ambulatory Visit: Payer: Self-pay | Admitting: Internal Medicine

## 2018-12-13 ENCOUNTER — Ambulatory Visit (INDEPENDENT_AMBULATORY_CARE_PROVIDER_SITE_OTHER): Payer: Self-pay | Admitting: Internal Medicine

## 2018-12-13 ENCOUNTER — Other Ambulatory Visit: Payer: Self-pay

## 2018-12-13 ENCOUNTER — Encounter: Payer: Self-pay | Admitting: Internal Medicine

## 2018-12-13 ENCOUNTER — Other Ambulatory Visit: Payer: Self-pay | Admitting: *Deleted

## 2018-12-13 VITALS — BP 152/92 | HR 89 | Temp 97.9°F | Wt 183.4 lb

## 2018-12-13 DIAGNOSIS — Z113 Encounter for screening for infections with a predominantly sexual mode of transmission: Secondary | ICD-10-CM

## 2018-12-13 DIAGNOSIS — Z79899 Other long term (current) drug therapy: Secondary | ICD-10-CM

## 2018-12-13 DIAGNOSIS — Z23 Encounter for immunization: Secondary | ICD-10-CM

## 2018-12-13 DIAGNOSIS — B20 Human immunodeficiency virus [HIV] disease: Secondary | ICD-10-CM

## 2018-12-13 NOTE — Assessment & Plan Note (Signed)
Doing well, no issues and rtc 6 months.  

## 2018-12-13 NOTE — Assessment & Plan Note (Signed)
Discussed pneumovax and given today 

## 2018-12-13 NOTE — Assessment & Plan Note (Signed)
Will check lipid panel. 

## 2018-12-13 NOTE — Assessment & Plan Note (Signed)
Will screen today 

## 2018-12-13 NOTE — Progress Notes (Signed)
   Subjective:    Patient ID: Ralph [name], male    DOB: 04/20/1990, 28 y.o.   MRN: [id number]  HPI Here for follow up of HIV Continues on [place] and no missed doses.  No new issues.  No associated n/v/d.  Recently got a new dog. Working a lot now.  Some purposeful weight loss.    Review of Systems  Constitutional: Negative for fatigue.  Gastrointestinal: Negative for diarrhea.  Skin: Negative for rash.       Objective:   Physical Exam Constitutional:      Appearance: He is well-developed.  Eyes:     General: No scleral icterus. Cardiovascular:     Rate and Rhythm: Normal rate and regular rhythm.  Skin:    Findings: No rash.  Neurological:     General: No focal deficit present.     Mental Status: He is alert.  Psychiatric:        Mood and Affect: Mood normal.    SH: + tobacco      Assessment & Plan:

## 2018-12-18 ENCOUNTER — Other Ambulatory Visit: Payer: Self-pay

## 2018-12-18 DIAGNOSIS — Z79899 Other long term (current) drug therapy: Secondary | ICD-10-CM

## 2018-12-18 DIAGNOSIS — Z113 Encounter for screening for infections with a predominantly sexual mode of transmission: Secondary | ICD-10-CM

## 2018-12-18 DIAGNOSIS — B20 Human immunodeficiency virus [HIV] disease: Secondary | ICD-10-CM

## 2018-12-19 LAB — T-HELPER CELL (CD4) - (RCID CLINIC ONLY)
CD4 % Helper T Cell: 40 % (ref 33–65)
CD4 T Cell Abs: 788 /uL (ref 400–1790)

## 2018-12-26 LAB — COMPLETE METABOLIC PANEL WITH GFR
AG Ratio: 1.5 (calc) (ref 1.0–2.5)
ALT: 12 U/L (ref 9–46)
AST: 5 U/L — ABNORMAL LOW (ref 10–40)
Albumin: 4 g/dL (ref 3.6–5.1)
Alkaline phosphatase (APISO): 74 U/L (ref 36–130)
BUN: 15 mg/dL (ref 7–25)
CO2: 24 mmol/L (ref 20–32)
Calcium: 9.6 mg/dL (ref 8.6–10.3)
Chloride: 104 mmol/L (ref 98–110)
Creat: 0.93 mg/dL (ref 0.60–1.35)
GFR, Est African American: 128 mL/min/{1.73_m2} (ref >=60)
GFR, Est Non African American: 111 mL/min/{1.73_m2} (ref >=60)
Globulin: 2.7 g/dL (calc) (ref 1.9–3.7)
Glucose, Bld: 86 mg/dL (ref 65–99)
Potassium: 4.2 mmol/L (ref 3.5–5.3)
Sodium: 138 mmol/L (ref 135–146)
Total Bilirubin: 0.5 mg/dL (ref 0.2–1.2)
Total Protein: 6.7 g/dL (ref 6.1–8.1)

## 2018-12-26 LAB — CBC WITH DIFFERENTIAL/PLATELET
Absolute Monocytes: 599 cells/uL (ref 200–950)
Basophils Absolute: 57 cells/uL (ref 0–200)
Basophils Relative: 0.7 %
Eosinophils Absolute: 284 cells/uL (ref 15–500)
Eosinophils Relative: 3.5 %
HCT: 44.8 % (ref 38.5–50.0)
Hemoglobin: 15.9 g/dL (ref 13.2–17.1)
Lymphs Abs: 1976 cells/uL (ref 850–3900)
MCH: 33.4 pg — ABNORMAL HIGH (ref 27.0–33.0)
MCHC: 35.5 g/dL (ref 32.0–36.0)
MCV: 94.1 fL (ref 80.0–100.0)
MPV: 10 fL (ref 7.5–12.5)
Monocytes Relative: 7.4 %
Neutro Abs: 5184 cells/uL (ref 1500–7800)
Neutrophils Relative %: 64 %
Platelets: 221 10*3/uL (ref 140–400)
RBC: 4.76 10*6/uL (ref 4.20–5.80)
RDW: 12.5 % (ref 11.0–15.0)
Total Lymphocyte: 24.4 %
WBC: 8.1 10*3/uL (ref 3.8–10.8)

## 2018-12-26 LAB — HIV-1 RNA QUANT-NO REFLEX-BLD
HIV 1 RNA Quant: <20 copies/mL
HIV-1 RNA Quant, Log: <1.3 Log copies/mL

## 2018-12-26 LAB — LIPID PANEL
Cholesterol: 187 mg/dL (ref <200)
HDL: 32 mg/dL — ABNORMAL LOW (ref >=40)
LDL Cholesterol (Calc): 123 mg/dL (calc) — ABNORMAL HIGH
Non-HDL Cholesterol (Calc): 155 mg/dL (calc) — ABNORMAL HIGH (ref <130)
Total CHOL/HDL Ratio: 5.8 (calc) — ABNORMAL HIGH (ref <5.0)
Triglycerides: 207 mg/dL — ABNORMAL HIGH (ref <150)

## 2018-12-26 LAB — RPR: RPR Ser Ql: NONREACTIVE

## 2019-01-17 ENCOUNTER — Other Ambulatory Visit: Payer: Self-pay | Admitting: Internal Medicine

## 2019-04-13 ENCOUNTER — Ambulatory Visit: Payer: Self-pay

## 2019-04-13 ENCOUNTER — Other Ambulatory Visit: Payer: Self-pay

## 2019-06-13 ENCOUNTER — Other Ambulatory Visit: Payer: Self-pay

## 2019-06-13 ENCOUNTER — Encounter: Payer: Self-pay | Admitting: Internal Medicine

## 2019-06-13 ENCOUNTER — Ambulatory Visit: Payer: BC Managed Care – PPO | Admitting: Internal Medicine

## 2019-06-13 VITALS — BP 138/91 | HR 114 | Temp 98.1°F | Ht 68.0 in | Wt 168.0 lb

## 2019-06-13 DIAGNOSIS — Z720 Tobacco use: Secondary | ICD-10-CM | POA: Diagnosis not present

## 2019-06-13 DIAGNOSIS — B20 Human immunodeficiency virus [HIV] disease: Secondary | ICD-10-CM

## 2019-06-13 NOTE — Addendum Note (Signed)
Addended by: Gardiner Barefoot on: 06/13/2019 09:06 AM   Modules accepted: Orders

## 2019-06-13 NOTE — Assessment & Plan Note (Signed)
He continues to do well, no issues. Labs today and rtc 6 months. 

## 2019-06-13 NOTE — Assessment & Plan Note (Signed)
Counseled on quitting °

## 2019-06-13 NOTE — Progress Notes (Signed)
   Subjective:    Patient ID: [name], male    DOB: 06/13/1990, 29 y.o.   MRN: [id number]  HPI Here for follow up of HIV He continues on Biktarvy and no missed doses.  No new issues.  No labs prior to the visit.    Review of Systems  Constitutional: Negative for fatigue.  Gastrointestinal: Negative for diarrhea.  Skin: Negative for rash.       Objective:   Physical Exam Constitutional:      Appearance: He is well-developed.  Eyes:     General: No scleral icterus. Cardiovascular:     Rate and Rhythm: Normal rate and regular rhythm.  Skin:    Findings: No rash.  Neurological:     General: No focal deficit present.     Mental Status: He is alert.  Psychiatric:        Mood and Affect: Mood normal.    SH: + tobacco      Assessment & Plan:

## 2019-06-14 LAB — HIV-1 RNA QUANT-NO REFLEX-BLD
HIV 1 RNA Quant: <20 copies/mL
HIV-1 RNA Quant, Log: <1.3 Log copies/mL

## 2019-06-14 LAB — T-HELPER CELL (CD4) - (RCID CLINIC ONLY)
CD4 % Helper T Cell: 41 % (ref 33–65)
CD4 T Cell Abs: 1314 /uL (ref 400–1790)

## 2019-07-23 ENCOUNTER — Other Ambulatory Visit: Payer: Self-pay | Admitting: Internal Medicine

## 2019-12-17 ENCOUNTER — Encounter: Payer: Self-pay | Admitting: Internal Medicine

## 2019-12-17 ENCOUNTER — Other Ambulatory Visit: Payer: Self-pay

## 2019-12-17 ENCOUNTER — Ambulatory Visit: Payer: BC Managed Care – PPO | Admitting: Internal Medicine

## 2019-12-17 VITALS — BP 150/99 | HR 106 | Temp 98.2°F | Wt 182.0 lb

## 2019-12-17 DIAGNOSIS — Z720 Tobacco use: Secondary | ICD-10-CM

## 2019-12-17 DIAGNOSIS — B20 Human immunodeficiency virus [HIV] disease: Secondary | ICD-10-CM

## 2019-12-17 DIAGNOSIS — Z113 Encounter for screening for infections with a predominantly sexual mode of transmission: Secondary | ICD-10-CM

## 2019-12-17 DIAGNOSIS — Z79899 Other long term (current) drug therapy: Secondary | ICD-10-CM | POA: Diagnosis not present

## 2019-12-17 NOTE — Assessment & Plan Note (Signed)
He continues to do well with no issues.  Continue Biktarvy and rtc in 6 months unless concerns.

## 2019-12-17 NOTE — Assessment & Plan Note (Signed)
Discussed cessation.  He is contemplative.

## 2019-12-17 NOTE — Assessment & Plan Note (Signed)
Lipid panel today

## 2019-12-17 NOTE — Assessment & Plan Note (Signed)
Will screen today. Unable to give urine sample

## 2019-12-17 NOTE — Progress Notes (Signed)
   Subjective:    Patient ID: Ralph [name], male    DOB: December 20, 1990, 29 y.o.   MRN: [id number]  HPI He is here for follow up of HIV He continues on Biktarvy and no missed doses.  Feels well and no new complaints.  He had his flu shot already by his PCP.  Had COVID x 2 - Pfizer.  Due for the booster end of December.    Review of Systems  Constitutional: Negative for unexpected weight change.  Gastrointestinal: Negative for diarrhea.  Skin: Negative for rash.       Objective:   Physical Exam Constitutional:      Appearance: He is well-developed.  Eyes:     General: No scleral icterus. Cardiovascular:     Rate and Rhythm: Normal rate and regular rhythm.  Skin:    Findings: No rash.  Neurological:     General: No focal deficit present.     Mental Status: He is alert.  Psychiatric:        Mood and Affect: Mood normal.    SH: + tobacco      Assessment & Plan:

## 2019-12-18 LAB — T-HELPER CELL (CD4) - (RCID CLINIC ONLY)
CD4 % Helper T Cell: 42 % (ref 33–65)
CD4 T Cell Abs: 929 /uL (ref 400–1790)

## 2019-12-20 LAB — COMPLETE METABOLIC PANEL WITH GFR
AG Ratio: 1.7 (calc) (ref 1.0–2.5)
ALT: 18 U/L (ref 9–46)
AST: 15 U/L (ref 10–40)
Albumin: 4.7 g/dL (ref 3.6–5.1)
Alkaline phosphatase (APISO): 90 U/L (ref 36–130)
BUN: 13 mg/dL (ref 7–25)
CO2: 29 mmol/L (ref 20–32)
Calcium: 9.9 mg/dL (ref 8.6–10.3)
Chloride: 103 mmol/L (ref 98–110)
Creat: 0.99 mg/dL (ref 0.60–1.35)
GFR, Est African American: 118 mL/min/{1.73_m2} (ref >=60)
GFR, Est Non African American: 102 mL/min/{1.73_m2} (ref >=60)
Globulin: 2.7 g/dL (calc) (ref 1.9–3.7)
Glucose, Bld: 74 mg/dL (ref 65–99)
Potassium: 4.3 mmol/L (ref 3.5–5.3)
Sodium: 139 mmol/L (ref 135–146)
Total Bilirubin: 0.4 mg/dL (ref 0.2–1.2)
Total Protein: 7.4 g/dL (ref 6.1–8.1)

## 2019-12-20 LAB — HIV-1 RNA QUANT-NO REFLEX-BLD
HIV 1 RNA Quant: <20 Copies/mL
HIV-1 RNA Quant, Log: <1.3 Log cps/mL

## 2019-12-20 LAB — CBC WITH DIFFERENTIAL/PLATELET
Absolute Monocytes: 741 cells/uL (ref 200–950)
Basophils Absolute: 67 cells/uL (ref 0–200)
Basophils Relative: 0.7 %
Eosinophils Absolute: 200 cells/uL (ref 15–500)
Eosinophils Relative: 2.1 %
HCT: 45.8 % (ref 38.5–50.0)
Hemoglobin: 15.7 g/dL (ref 13.2–17.1)
Lymphs Abs: 2337 cells/uL (ref 850–3900)
MCH: 31.7 pg (ref 27.0–33.0)
MCHC: 34.3 g/dL (ref 32.0–36.0)
MCV: 92.5 fL (ref 80.0–100.0)
MPV: 9.4 fL (ref 7.5–12.5)
Monocytes Relative: 7.8 %
Neutro Abs: 6156 cells/uL (ref 1500–7800)
Neutrophils Relative %: 64.8 %
Platelets: 275 10*3/uL (ref 140–400)
RBC: 4.95 10*6/uL (ref 4.20–5.80)
RDW: 12.4 % (ref 11.0–15.0)
Total Lymphocyte: 24.6 %
WBC: 9.5 10*3/uL (ref 3.8–10.8)

## 2019-12-20 LAB — LIPID PANEL
Cholesterol: 217 mg/dL — ABNORMAL HIGH (ref <200)
HDL: 45 mg/dL (ref >=40)
LDL Cholesterol (Calc): 139 mg/dL (calc) — ABNORMAL HIGH
Non-HDL Cholesterol (Calc): 172 mg/dL (calc) — ABNORMAL HIGH (ref <130)
Total CHOL/HDL Ratio: 4.8 (calc) (ref <5.0)
Triglycerides: 189 mg/dL — ABNORMAL HIGH (ref <150)

## 2019-12-20 LAB — RPR: RPR Ser Ql: NONREACTIVE

## 2019-12-26 ENCOUNTER — Other Ambulatory Visit: Payer: Self-pay | Admitting: Internal Medicine

## 2020-06-08 ENCOUNTER — Encounter (HOSPITAL_COMMUNITY): Payer: Self-pay | Admitting: Emergency Medicine

## 2020-06-08 ENCOUNTER — Other Ambulatory Visit: Payer: Self-pay

## 2020-06-08 ENCOUNTER — Emergency Department (HOSPITAL_COMMUNITY): Payer: BC Managed Care – PPO

## 2020-06-08 ENCOUNTER — Emergency Department (HOSPITAL_COMMUNITY)
Admission: EM | Admit: 2020-06-08 | Discharge: 2020-06-09 | Disposition: A | Discharge status: Home / Self Care | Payer: BC Managed Care – PPO | Attending: Emergency Medicine | Admitting: Emergency Medicine

## 2020-06-08 DIAGNOSIS — R0602 Shortness of breath: Secondary | ICD-10-CM | POA: Diagnosis not present

## 2020-06-08 DIAGNOSIS — R61 Generalized hyperhidrosis: Secondary | ICD-10-CM | POA: Diagnosis not present

## 2020-06-08 DIAGNOSIS — R002 Palpitations: Secondary | ICD-10-CM | POA: Diagnosis not present

## 2020-06-08 DIAGNOSIS — Z21 Asymptomatic human immunodeficiency virus [HIV] infection status: Secondary | ICD-10-CM | POA: Diagnosis not present

## 2020-06-08 DIAGNOSIS — R200 Anesthesia of skin: Secondary | ICD-10-CM | POA: Insufficient documentation

## 2020-06-08 DIAGNOSIS — F17210 Nicotine dependence, cigarettes, uncomplicated: Secondary | ICD-10-CM | POA: Insufficient documentation

## 2020-06-08 DIAGNOSIS — R42 Dizziness and giddiness: Secondary | ICD-10-CM | POA: Diagnosis not present

## 2020-06-08 DIAGNOSIS — R072 Precordial pain: Secondary | ICD-10-CM | POA: Insufficient documentation

## 2020-06-08 DIAGNOSIS — R079 Chest pain, unspecified: Secondary | ICD-10-CM | POA: Diagnosis present

## 2020-06-08 NOTE — ED Triage Notes (Signed)
Pt with c/o L sided chest pain. Describes pain as squeezing. States has L arm numbness as well and "feels like his heart is beating fast".

## 2020-06-09 LAB — CBC
HCT: 45.2 % (ref 39.0–52.0)
Hemoglobin: 16.2 g/dL (ref 13.0–17.0)
MCH: 33.3 pg (ref 26.0–34.0)
MCHC: 35.8 g/dL (ref 30.0–36.0)
MCV: 92.8 fL (ref 80.0–100.0)
Platelets: 232 10*3/uL (ref 150–400)
RBC: 4.87 MIL/uL (ref 4.22–5.81)
RDW: 12.1 % (ref 11.5–15.5)
WBC: 10.2 10*3/uL (ref 4.0–10.5)
nRBC: 0 % (ref 0.0–0.2)

## 2020-06-09 LAB — BASIC METABOLIC PANEL
Anion gap: 9 (ref 5–15)
BUN: 12 mg/dL (ref 6–20)
CO2: 25 mmol/L (ref 22–32)
Calcium: 9.6 mg/dL (ref 8.9–10.3)
Chloride: 104 mmol/L (ref 98–111)
Creatinine, Ser: 1 mg/dL (ref 0.61–1.24)
GFR, Estimated: >60 mL/min (ref >60)
Glucose, Bld: 104 mg/dL — ABNORMAL HIGH (ref 70–99)
Potassium: 3.5 mmol/L (ref 3.5–5.1)
Sodium: 138 mmol/L (ref 135–145)

## 2020-06-09 LAB — D-DIMER, QUANTITATIVE: D-Dimer, Quant: <0.27 ug/mL-FEU (ref 0.00–0.50)

## 2020-06-09 LAB — TSH: TSH: 2.873 u[IU]/mL (ref 0.350–4.500)

## 2020-06-09 LAB — TROPONIN I (HIGH SENSITIVITY)
Troponin I (High Sensitivity): 3 ng/L (ref <18)
Troponin I (High Sensitivity): 3 ng/L (ref <18)

## 2020-06-09 NOTE — ED Provider Notes (Signed)
[facility] EMERGENCY DEPARTMENT Provider Note   CSN: [id number] Arrival date & time: 06/08/20  2324     History Chief Complaint  Patient presents with  . Chest Pain    Ralph [name] is a 30 y.o. male.  HPI  HPI: A 30 year old patient presents for evaluation of chest pain. Initial onset of pain was approximately 1-3 hours ago. The patient's chest pain is sharp and is not worse with exertion. The patient reports some diaphoresis. The patient's chest pain is middle- or left-sided, is not well-localized, is not described as heaviness/pressure/tightness and does radiate to the arms/jaw/neck. The patient does not complain of nausea. The patient has smoked in the past 90 days and has a family history of coronary artery disease in a first-degree relative with onset less than age 26. The patient has no history of stroke, has no history of peripheral artery disease, denies any history of treated diabetes, is not hypertensive, has no history of hypercholesterolemia and does not have an elevated BMI (>=30).  Patient with history of well-controlled HIV presents with chest pain.  Patient reports he was at his baseline when approximately 2-3 hours ago he began having the feeling that his heart was racing.  He reports he could "see his heart beating through the chest " This occurred at rest.  He reports he began feeling short of breath.  When he stood up he started feeling lightheaded & diaphoretic.  Reports the pain was mostly sharp and at times it was worse with breathing.  He then began having numbness in his left arm so he came to the ER to be evaluated.  He has never had this before.  No history of CAD/VTE. He is a smoker.  He reports drinking an energy drink earlier in the day.  Denies drug abuse He is now feeling improved  PMH- HIV Soc hx - smoker Denies drug use Patient Active Problem List   Diagnosis Date Noted  . Anxiety 06/02/2017  . Tobacco abuse 02/01/2017  . Medication monitoring  encounter 10/28/2016  . Human immunodeficiency virus (HIV) disease (HCC) 09/22/2016  . Screening examination for venereal disease 09/22/2016  . Encounter for long-term (current) use of medications 09/22/2016    Past Surgical History:  Procedure Laterality Date  . INNER EAR SURGERY         History reviewed. No pertinent family history.  Social History   Tobacco Use  . Smoking status: Current Every Day Smoker    Packs/day: 0.25    Types: Cigarettes  . Smokeless tobacco: Never Used  . Tobacco comment: occ  Substance Use Topics  . Alcohol use: Yes    Comment: occassional  . Drug use: No    Home Medications Prior to Admission medications   Medication Sig Start Date End Date Taking? Authorizing Provider  amphetamine-dextroamphetamine (ADDERALL) 30 MG tablet Take 30 mg by mouth daily.    [provider]  BIKTARVY 50-200-25 MG TABS tablet TAKE 1 TABLET BY MOUTH DAILY 12/26/19   Comer, Belia Heman, MD    Allergies    Patient has no known allergies.  Review of Systems   Review of Systems  Constitutional: Positive for diaphoresis. Negative for fever.  Respiratory: Positive for shortness of breath.   Cardiovascular: Positive for chest pain and palpitations. Negative for leg swelling.  Gastrointestinal: Negative for abdominal pain.  Neurological: Positive for light-headedness. Negative for syncope.  All other systems reviewed and are negative.   Physical Exam Updated Vital Signs BP (!) 137/102  Pulse 87   Temp 97.9 F (36.6 C) (Oral)   Resp 17   Ht 1.727 m (5\' 8" )   Wt 86.2 kg   SpO2 98%   BMI 28.89 kg/m   Physical Exam CONSTITUTIONAL: Well developed/well nourished, no distress HEAD: Normocephalic/atraumatic EYES: EOMI/PERRL ENMT: Mucous membranes moist NECK: supple no meningeal signs SPINE/BACK:entire spine nontender CV: S1/S2 noted, no murmurs/rubs/gallops noted LUNGS: Lungs are clear to auscultation bilaterally, no apparent distress Chest-mild  left-sided chest wall tenderness.  No crepitus or rash ABDOMEN: soft, nontender, no rebound or guarding, bowel sounds noted throughout abdomen GU:no cva tenderness NEURO: Pt is awake/alert/appropriate, moves all extremitiesx4.  No facial droop.   EXTREMITIES: pulses normal/equal, full ROM, no calf tenderness or edema SKIN: warm, color normal PSYCH: no abnormalities of mood noted, alert and oriented to situation  ED Results / Procedures / Treatments   Labs (all labs ordered are listed, but only abnormal results are displayed) Labs Reviewed  BASIC METABOLIC PANEL - Abnormal; Notable for the following components:      Result Value   Glucose, Bld 104 (*)    All other components within normal limits  CBC  TSH  D-DIMER, QUANTITATIVE  TROPONIN I (HIGH SENSITIVITY)  TROPONIN I (HIGH SENSITIVITY)    EKG EKG Interpretation  Date/Time:  Sunday Jun 08 2020 23:43:26 EDT Ventricular Rate:  96 PR Interval:  157 QRS Duration: 111 QT Interval:  385 QTC Calculation: 487 R Axis:   81 Text Interpretation: Sinus rhythm Probable left atrial enlargement Borderline prolonged QT interval No previous ECGs available Confirmed by 08-26-1997 (Zadie Rhine) on 06/08/2020 11:53:45 PM   Radiology DG Chest Portable 1 View  Result Date: 06/09/2020 CLINICAL DATA:  Chest pain EXAM: PORTABLE CHEST 1 VIEW COMPARISON:  None. FINDINGS: The heart size and mediastinal contours are within normal limits. Both lungs are clear. The visualized skeletal structures are unremarkable. IMPRESSION: No active disease. Electronically Signed   By: 06/11/2020 M.D.   On: 06/09/2020 00:27    Procedures Procedures   Medications Ordered in ED Medications - No data to display  ED Course  I have reviewed the triage vital signs and the nursing notes.  Pertinent labs & imaging results that were available during my care of the patient were reviewed by me and considered in my medical decision making (see chart for details).     MDM Rules/Calculators/A&P HEAR Score: 2                         This patient presents to the ED for concern of chest pain and palpitation, this involves an extensive number of treatment options, and is a complaint that carries with it a high risk of complications and morbidity.  The differential diagnosis includes acute coronary syndrome, pulmonary embolism, aortic dissection, pericarditis, pneumonia, cardiac dysrhythmia   Lab Tests:   I Ordered, reviewed, and interpreted labs, which included troponin, D-dimer, electrolytes, TSH, blood count  Imaging Studies ordered:   I ordered imaging studies which included chest x-ray   I independently visualized and interpreted imaging which showed no acute findings  Additional history obtained:   Additional history obtained from family member  Previous records obtained and reviewed    Reevaluation:  After the interventions stated above, I reevaluated the patient and found pt is improved  3:14 AM Pt improved No distress Evaluation unremarkable Low suspicion for ACS/PE/Dissection or cardiac arrhythmia F/u as outpatient for monitoring, referral to cardiology given We discussed return  precautions Final Clinical Impression(s) / ED Diagnoses Final diagnoses:  Precordial pain  Palpitations    Rx / DC Orders ED Discharge Orders    None       Zadie Rhine, MD 06/09/20 [phone]

## 2020-06-17 ENCOUNTER — Ambulatory Visit: Payer: BC Managed Care – PPO | Admitting: Internal Medicine

## 2020-06-17 ENCOUNTER — Encounter: Payer: Self-pay | Admitting: Internal Medicine

## 2020-06-17 ENCOUNTER — Other Ambulatory Visit: Payer: Self-pay

## 2020-06-17 VITALS — BP 133/87 | HR 90 | Wt 197.0 lb

## 2020-06-17 DIAGNOSIS — Z720 Tobacco use: Secondary | ICD-10-CM | POA: Diagnosis not present

## 2020-06-17 DIAGNOSIS — B20 Human immunodeficiency virus [HIV] disease: Secondary | ICD-10-CM

## 2020-06-17 NOTE — Assessment & Plan Note (Signed)
He is doing well, no concerns.  Labs today and rtc in 6 months.
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
## 2020-06-17 NOTE — Assessment & Plan Note (Signed)
Discussed cessation and he continues to be interested in quitting despite recent stressors and will continue his efforts.

## 2020-06-18 LAB — HIV-1 RNA QUANT-NO REFLEX-BLD
HIV 1 RNA Quant: NOT DETECTED Copies/mL
HIV-1 RNA Quant, Log: NOT DETECTED Log cps/mL

## 2020-06-19 LAB — T-HELPER CELL (CD4) - (RCID CLINIC ONLY)
CD4 % Helper T Cell: 40 % (ref 33–65)
CD4 T Cell Abs: 858 /uL (ref 400–1790)

## 2020-07-06 ENCOUNTER — Other Ambulatory Visit: Payer: Self-pay | Admitting: Internal Medicine

## 2020-07-06 DIAGNOSIS — B20 Human immunodeficiency virus [HIV] disease: Secondary | ICD-10-CM

## 2020-09-13 ENCOUNTER — Encounter (HOSPITAL_COMMUNITY): Payer: Self-pay | Admitting: Emergency Medicine

## 2020-09-13 ENCOUNTER — Emergency Department (HOSPITAL_COMMUNITY)
Admission: EM | Admit: 2020-09-13 | Discharge: 2020-09-14 | Disposition: A | Discharge status: Home / Self Care | Payer: BC Managed Care – PPO | Attending: Emergency Medicine | Admitting: Emergency Medicine

## 2020-09-13 ENCOUNTER — Emergency Department (HOSPITAL_COMMUNITY): Payer: BC Managed Care – PPO

## 2020-09-13 ENCOUNTER — Other Ambulatory Visit: Payer: Self-pay

## 2020-09-13 DIAGNOSIS — H538 Other visual disturbances: Secondary | ICD-10-CM | POA: Diagnosis not present

## 2020-09-13 DIAGNOSIS — L03116 Cellulitis of left lower limb: Secondary | ICD-10-CM | POA: Diagnosis not present

## 2020-09-13 DIAGNOSIS — Z5321 Procedure and treatment not carried out due to patient leaving prior to being seen by health care provider: Secondary | ICD-10-CM | POA: Insufficient documentation

## 2020-09-13 DIAGNOSIS — F17210 Nicotine dependence, cigarettes, uncomplicated: Secondary | ICD-10-CM | POA: Insufficient documentation

## 2020-09-13 DIAGNOSIS — M25572 Pain in left ankle and joints of left foot: Secondary | ICD-10-CM | POA: Diagnosis present

## 2020-09-13 DIAGNOSIS — R030 Elevated blood-pressure reading, without diagnosis of hypertension: Secondary | ICD-10-CM | POA: Insufficient documentation

## 2020-09-13 DIAGNOSIS — Z21 Asymptomatic human immunodeficiency virus [HIV] infection status: Secondary | ICD-10-CM | POA: Diagnosis not present

## 2020-09-13 LAB — CBC WITH DIFFERENTIAL/PLATELET
Abs Immature Granulocytes: 0.01 10*3/uL (ref 0.00–0.07)
Basophils Absolute: 0.1 10*3/uL (ref 0.0–0.1)
Basophils Relative: 1 %
Eosinophils Absolute: 0.2 10*3/uL (ref 0.0–0.5)
Eosinophils Relative: 2 %
HCT: 45.7 % (ref 39.0–52.0)
Hemoglobin: 16.1 g/dL (ref 13.0–17.0)
Immature Granulocytes: 0 %
Lymphocytes Relative: 25 %
Lymphs Abs: 2.1 10*3/uL (ref 0.7–4.0)
MCH: 32.5 pg (ref 26.0–34.0)
MCHC: 35.2 g/dL (ref 30.0–36.0)
MCV: 92.3 fL (ref 80.0–100.0)
Monocytes Absolute: 0.5 10*3/uL (ref 0.1–1.0)
Monocytes Relative: 6 %
Neutro Abs: 5.4 10*3/uL (ref 1.7–7.7)
Neutrophils Relative %: 66 %
Platelets: 237 10*3/uL (ref 150–400)
RBC: 4.95 MIL/uL (ref 4.22–5.81)
RDW: 11.7 % (ref 11.5–15.5)
WBC: 8.2 10*3/uL (ref 4.0–10.5)
nRBC: 0 % (ref 0.0–0.2)

## 2020-09-13 LAB — BASIC METABOLIC PANEL
Anion gap: 12 (ref 5–15)
BUN: 8 mg/dL (ref 6–20)
CO2: 24 mmol/L (ref 22–32)
Calcium: 9.5 mg/dL (ref 8.9–10.3)
Chloride: 102 mmol/L (ref 98–111)
Creatinine, Ser: 0.93 mg/dL (ref 0.61–1.24)
GFR, Estimated: >60 mL/min (ref >60)
Glucose, Bld: 95 mg/dL (ref 70–99)
Potassium: 3.5 mmol/L (ref 3.5–5.1)
Sodium: 138 mmol/L (ref 135–145)

## 2020-09-13 NOTE — ED Provider Notes (Addendum)
Emergency Medicine Provider Triage Evaluation Note  Ralph [name] , a 30 y.o. male  was evaluated in triage.  Pt complains of left ankle swelling. Noticed erythema to right medial aspect left ankle. Feels like this area is tingly. No recent trauma. Also notes feels like his vision in his right eye is blurred. No weakness, slurred speech, facial droop. No neck pain, fever. Compliant with HIV meds. No eye irritation, redness. No UA complaints  Review of Systems  Positive: Right eye vision changes, left ankle pain and swelling Negative: Weakness,   Physical Exam  There were no vitals taken for this visit. Gen:   Awake, no distress   Eye:  No obvious deformity to eye Resp:  Normal effort  MSK:   Moves extremities without difficulty, tenderness to medial aspect left ankle, minimal overlying erythema, No pain with ROM to ankle. No edema, warmth, drainage. No obvious effusion Neuro:  Cn 2-12 grossly intact, Intact sensation and strength Other:    Medical Decision Making  Medically screening exam initiated at 6:19 PM.  Appropriate orders placed.  Ralph [name] was informed that the remainder of the evaluation will be completed by another provider, this initial triage assessment does not replace that evaluation, and the importance of remaining in the ED until their evaluation is complete.  Right eye vision changes, left ankle pain  Patient is not a code stroke, No LVO criteria  Stable, work up started       [employer], Valli Glance, PA-C 09/13/20 1823    Terald Sleeper, MD 09/13/20 [phone]

## 2020-09-13 NOTE — ED Triage Notes (Signed)
Patient coming from work, complaint of foot pain that began at work, also endorses blurred vision. Red spot on inner right ankle.

## 2020-09-13 NOTE — ED Notes (Signed)
Pt left due to not being seen quick enough. Pt going to [employer]

## 2020-09-14 ENCOUNTER — Emergency Department (HOSPITAL_COMMUNITY)
Admission: EM | Admit: 2020-09-14 | Discharge: 2020-09-14 | Disposition: A | Discharge status: Home / Self Care | Payer: BC Managed Care – PPO | Source: Home / Self Care | Attending: Emergency Medicine | Admitting: Emergency Medicine

## 2020-09-14 ENCOUNTER — Other Ambulatory Visit: Payer: Self-pay

## 2020-09-14 ENCOUNTER — Encounter (HOSPITAL_COMMUNITY): Payer: Self-pay | Admitting: Emergency Medicine

## 2020-09-14 DIAGNOSIS — Z21 Asymptomatic human immunodeficiency virus [HIV] infection status: Secondary | ICD-10-CM | POA: Insufficient documentation

## 2020-09-14 DIAGNOSIS — R030 Elevated blood-pressure reading, without diagnosis of hypertension: Secondary | ICD-10-CM

## 2020-09-14 DIAGNOSIS — F17210 Nicotine dependence, cigarettes, uncomplicated: Secondary | ICD-10-CM | POA: Insufficient documentation

## 2020-09-14 DIAGNOSIS — L03116 Cellulitis of left lower limb: Secondary | ICD-10-CM

## 2020-09-14 MED ORDER — CEPHALEXIN 500 MG PO CAPS
500.0000 mg | ORAL_CAPSULE | Freq: Once | ORAL | Status: AC
  Administered 2020-09-14: 500 mg via ORAL
  Filled 2020-09-14: qty 1

## 2020-09-14 MED ORDER — CEPHALEXIN 500 MG PO CAPS: 500.0000 mg | ORAL_CAPSULE | Freq: Three times a day (TID) | ORAL | 0 refills | Status: DC

## 2020-09-14 NOTE — Discharge Instructions (Addendum)
Please apply warm compresses several times a day.  You may take acetaminophen and/or ibuprofen as needed for pain.  Return if the swelling seems like it is getting worse.

## 2020-09-14 NOTE — ED Provider Notes (Signed)
[facility] EMERGENCY DEPARTMENT Provider Note   CSN: [id number] Arrival date & time: 09/14/20  0013     History Chief Complaint  Patient presents with   Insect Bite    Ralph [name] is a 30 y.o. male.  The history is provided by the patient.  He has history of HIV disease and comes in with possible spider bite of his left ankle.  While working, he noted that the medial aspect of his left ankle was painful and he noted that it was red.  When he pushed, some honey colored material would use out of it.  He rates pain at 6/10.  It is worse when he moves his ankle.  He does not recall getting bitten.  He denies fever or chills.   History reviewed. No pertinent past medical history.  Patient Active Problem List   Diagnosis Date Noted   Anxiety 06/02/2017   Tobacco abuse 02/01/2017   Medication monitoring encounter 10/28/2016   Human immunodeficiency virus (HIV) disease (HCC) 09/22/2016   Screening examination for venereal disease 09/22/2016   Encounter for long-term (current) use of medications 09/22/2016    Past Surgical History:  Procedure Laterality Date   INNER EAR SURGERY         History reviewed. No pertinent family history.  Social History   Tobacco Use   Smoking status: Every Day    Packs/day: 0.25    Types: Cigarettes   Smokeless tobacco: Never   Tobacco comments:    occ  Substance Use Topics   Alcohol use: Yes    Comment: occassional   Drug use: No    Home Medications Prior to Admission medications   Medication Sig Start Date End Date Taking? Authorizing Provider  cephALEXin (KEFLEX) 500 MG capsule Take 1 capsule (500 mg total) by mouth 3 (three) times daily. 09/14/20  Yes Dione Booze, MD  amphetamine-dextroamphetamine (ADDERALL) 30 MG tablet Take 30 mg by mouth daily.    [provider]  BIKTARVY 50-200-25 MG TABS tablet TAKE 1 TABLET BY MOUTH DAILY 07/07/20   Comer, Belia Heman, MD    Allergies    Patient has no known  allergies.  Review of Systems   Review of Systems  All other systems reviewed and are negative.  Physical Exam Updated Vital Signs BP (!) 144/100 (BP Location: Right Arm)   Pulse 85   Temp 98.7 F (37.1 C) (Oral)   Resp 17   Ht 5\' 8"  (1.727 m)   Wt 89.4 kg   SpO2 100%   BMI 29.97 kg/m   Physical Exam Vitals and nursing note reviewed.  30 year old male, resting comfortably and in no acute distress. Vital signs are significant for elevated blood pressure. Oxygen saturation is 100%, which is normal. Head is normocephalic and atraumatic. PERRLA, EOMI. Oropharynx is clear. Neck is nontender and supple without adenopathy or JVD. Back is nontender. Lungs are clear without rales, wheezes, or rhonchi. Chest is nontender. Heart has regular rate and rhythm without murmur. Abdomen is soft, flat, nontender. Extremities: There is a splotchy area of erythema on the anteromedial aspect of the left ankle.  This is slightly warm to the touch but there is no swelling or fluctuance.  No drainage is seen.  There are no lymphangitic streaks seen.  Full passive range of motion is present. Skin is warm and dry without rash. Neurologic: Mental status is normal, cranial nerves are intact, moves all extremities equally.  ED Results / Procedures / Treatments  Radiology DG Ankle Complete Left  Result Date: 09/13/2020 CLINICAL DATA:  Left ankle pain and swelling after possible spider bite. EXAM: LEFT ANKLE COMPLETE - 3+ VIEW COMPARISON:  None. FINDINGS: There is no evidence of fracture, dislocation, or joint effusion. There is no evidence of arthropathy or other focal bone abnormality. Very mild diffuse soft tissue swelling is noted. IMPRESSION: 1. No acute osseous abnormality. 2. Very mild diffuse soft tissue swelling. Electronically Signed   By: Aram Candela M.D.   On: 09/13/2020 19:20   CT HEAD WO CONTRAST ( )  Result Date: 09/13/2020 CLINICAL DATA:  Diplopia. EXAM: CT HEAD WITHOUT CONTRAST  TECHNIQUE: Contiguous axial images were obtained from the base of the skull through the vertex without intravenous contrast. COMPARISON:  Head CT dated 02/07/2005. FINDINGS: Brain: The ventricles and sulci appropriate size for patient's age. The gray-white matter discrimination is preserved. There is no acute intracranial hemorrhage. No mass effect or midline shift. No extra-axial fluid collection. Vascular: No hyperdense vessel or unexpected calcification. Skull: Normal. Negative for fracture or focal lesion. Sinuses/Orbits: There is complete opacification of the visualized right maxillary sinus. The remainder of the visualized paranasal sinuses mastoid air cells are clear. Other: None IMPRESSION: 1. Unremarkable noncontrast CT of the brain. 2. Right maxillary sinus disease. Electronically Signed   By: Elgie Collard M.D.   On: 09/13/2020 19:10    Procedures Ultrasound ED Soft Tissue  Date/Time: 09/14/2020 1:50 AM Performed by: Dione Booze, MD Authorized by: Dione Booze, MD   Procedure details:    Indications: evaluate for cellulitis     Transverse view:  Visualized   Longitudinal view:  Visualized   Images: archived   Location:    Location: lower extremity     Side:  Right Findings:     no abscess present    no cellulitis present   Medications Ordered in ED Medications  cephALEXin (KEFLEX) capsule 500 mg (has no administration in time range)    ED Course  I have reviewed the triage vital signs and the nursing notes.  Pertinent imaging results that were available during my care of the patient were reviewed by me and considered in my medical decision making (see chart for details).   MDM Rules/Calculators/A&P                         Left ankle erythema possibly consistent with arthropod bite, possible early cellulitis.  No evidence of abscess on exam.  Limited bedside ultrasound was done showing no evidence of abscess, also no changes suggestive of cellulitis.  Given his HIV  status, it is felt prudent to cover him with antibiotics.  It is also noted that he had come from [facility] emergency department where he had complained of some blurred vision in his eye, he voiced no complaints about his vision to me.  X-ray of the ankle and CT of head obtained there were unremarkable.  He is discharged with prescription for cephalexin, told to apply topical hydrocortisone cream.  Follow-up with PCP in 2 days for recheck.  Return precautions discussed.  Final Clinical Impression(s) / ED Diagnoses Final diagnoses:  Cellulitis of left ankle  Elevated blood pressure reading without diagnosis of hypertension    Rx / DC Orders ED Discharge Orders          Ordered    cephALEXin (KEFLEX) 500 MG capsule  3 times daily        09/14/20 0142  Dione Booze, MD 09/14/20 [phone]

## 2020-09-14 NOTE — ED Triage Notes (Signed)
Pt c/o possible insect bite to left ankle. Area red and having drainage.

## 2020-12-19 ENCOUNTER — Encounter: Payer: Self-pay | Admitting: Internal Medicine

## 2020-12-19 ENCOUNTER — Other Ambulatory Visit (HOSPITAL_COMMUNITY)
Admission: RE | Admit: 2020-12-19 | Discharge: 2020-12-19 | Disposition: A | Discharge status: Home / Self Care | Payer: BC Managed Care – PPO | Source: Ambulatory Visit | Attending: Internal Medicine | Admitting: Internal Medicine

## 2020-12-19 ENCOUNTER — Ambulatory Visit: Payer: BC Managed Care – PPO | Admitting: Internal Medicine

## 2020-12-19 ENCOUNTER — Other Ambulatory Visit: Payer: Self-pay

## 2020-12-19 VITALS — BP 145/97 | HR 94 | Temp 98.0°F | Wt 177.0 lb

## 2020-12-19 DIAGNOSIS — B20 Human immunodeficiency virus [HIV] disease: Secondary | ICD-10-CM

## 2020-12-19 DIAGNOSIS — Z113 Encounter for screening for infections with a predominantly sexual mode of transmission: Secondary | ICD-10-CM

## 2020-12-19 DIAGNOSIS — Z79899 Other long term (current) drug therapy: Secondary | ICD-10-CM | POA: Diagnosis not present

## 2020-12-19 DIAGNOSIS — Z23 Encounter for immunization: Secondary | ICD-10-CM | POA: Diagnosis not present

## 2020-12-19 NOTE — Assessment & Plan Note (Signed)
Will check a routine lipid panel

## 2020-12-19 NOTE — Assessment & Plan Note (Signed)
He continues to do well on Biktarvy and no changes indicated.  He will get labs today and rtc in 6 months unless concerns.

## 2020-12-19 NOTE — Progress Notes (Signed)
   Subjective:    Patient ID: Ralph [name], male    DOB: [birth year], 30 y.o.   MRN: [id number]  HPI Here for follow up of HIV He continues on Biktarvy and no missed doses.  No labs prior to the visit.  He has had no issues with getting, taking or tolerating Biktarvy.     Review of Systems  Constitutional:  Negative for fatigue.  Gastrointestinal:  Negative for diarrhea and nausea.  Skin:  Negative for rash.      Objective:   Physical Exam Eyes:     General: No scleral icterus. Pulmonary:     Effort: Pulmonary effort is normal.  Skin:    Findings: No rash.  Neurological:     General: No focal deficit present.     Mental Status: He is alert.  Psychiatric:        Mood and Affect: Mood normal.   SH: + tobacco       Assessment & Plan:

## 2020-12-19 NOTE — Assessment & Plan Note (Signed)
Will screen 

## 2020-12-22 LAB — URINE CYTOLOGY ANCILLARY ONLY
Chlamydia: NEGATIVE
Comment: NEGATIVE
Comment: NORMAL
Neisseria Gonorrhea: NEGATIVE

## 2020-12-22 LAB — LIPID PANEL
Cholesterol: 221 mg/dL — ABNORMAL HIGH (ref <200)
HDL: 48 mg/dL (ref >=40)
LDL Cholesterol (Calc): 152 mg/dL (calc) — ABNORMAL HIGH
Non-HDL Cholesterol (Calc): 173 mg/dL (calc) — ABNORMAL HIGH (ref <130)
Total CHOL/HDL Ratio: 4.6 (calc) (ref <5.0)
Triglycerides: 99 mg/dL (ref <150)

## 2020-12-22 LAB — RPR: RPR Ser Ql: NONREACTIVE

## 2020-12-22 LAB — HIV-1 RNA QUANT-NO REFLEX-BLD
HIV 1 RNA Quant: NOT DETECTED Copies/mL
HIV-1 RNA Quant, Log: NOT DETECTED Log cps/mL

## 2020-12-22 LAB — T-HELPER CELLS (CD4) COUNT (NOT AT ARMC)
Absolute CD4: 786 cells/uL (ref 490–1740)
CD4 T Helper %: 41 % (ref 30–61)
Total lymphocyte count: 1899 cells/uL (ref 850–3900)

## 2021-01-12 ENCOUNTER — Other Ambulatory Visit: Payer: Self-pay | Admitting: Internal Medicine

## 2021-01-12 DIAGNOSIS — B20 Human immunodeficiency virus [HIV] disease: Secondary | ICD-10-CM

## 2021-03-02 ENCOUNTER — Other Ambulatory Visit (HOSPITAL_COMMUNITY): Payer: Self-pay

## 2021-03-02 ENCOUNTER — Other Ambulatory Visit: Payer: Self-pay

## 2021-03-02 DIAGNOSIS — B20 Human immunodeficiency virus [HIV] disease: Secondary | ICD-10-CM

## 2021-03-02 MED ORDER — BIKTARVY 50-200-25 MG PO TABS: 1.0000 | ORAL_TABLET | Freq: Every day | ORAL | 5 refills | Status: DC

## 2021-03-05 ENCOUNTER — Other Ambulatory Visit: Payer: Self-pay | Admitting: Pharmacist

## 2021-03-05 DIAGNOSIS — B20 Human immunodeficiency virus [HIV] disease: Secondary | ICD-10-CM

## 2021-03-05 MED ORDER — BIKTARVY 50-200-25 MG PO TABS: 1.0000 | ORAL_TABLET | Freq: Every day | ORAL | 0 refills | Status: AC

## 2021-03-05 NOTE — Progress Notes (Signed)
Medication Samples have been provided to the patient. ° °Drug name: Biktarvy        °Strength: 50/200/25 mg       °Qty: 14 tablets (2 bottles) °LOT: [id number]   °Exp.Date: 10/19/2022 ° °Dosing instructions: Take one tablet by mouth once daily ° °The patient has been instructed regarding the correct time, dose, and frequency of taking this medication, including desired effects and most common side effects.  ° °Valeriano Bain L. Chemika Nightengale, PharmD, BCIDP, AAHIVP, CPP °Clinical Pharmacist Practitioner °Infectious Diseases Clinical Pharmacist °[facility] °12/31/2019, 10:07 AM ° °

## 2021-06-05 IMAGING — DX DG CHEST 1V PORT
1 series · 1 of 1 positions shown · non-contrast
Comparison: None.

CLINICAL DATA: Chest pain

EXAM:
PORTABLE CHEST 1 VIEW

[chest ap]
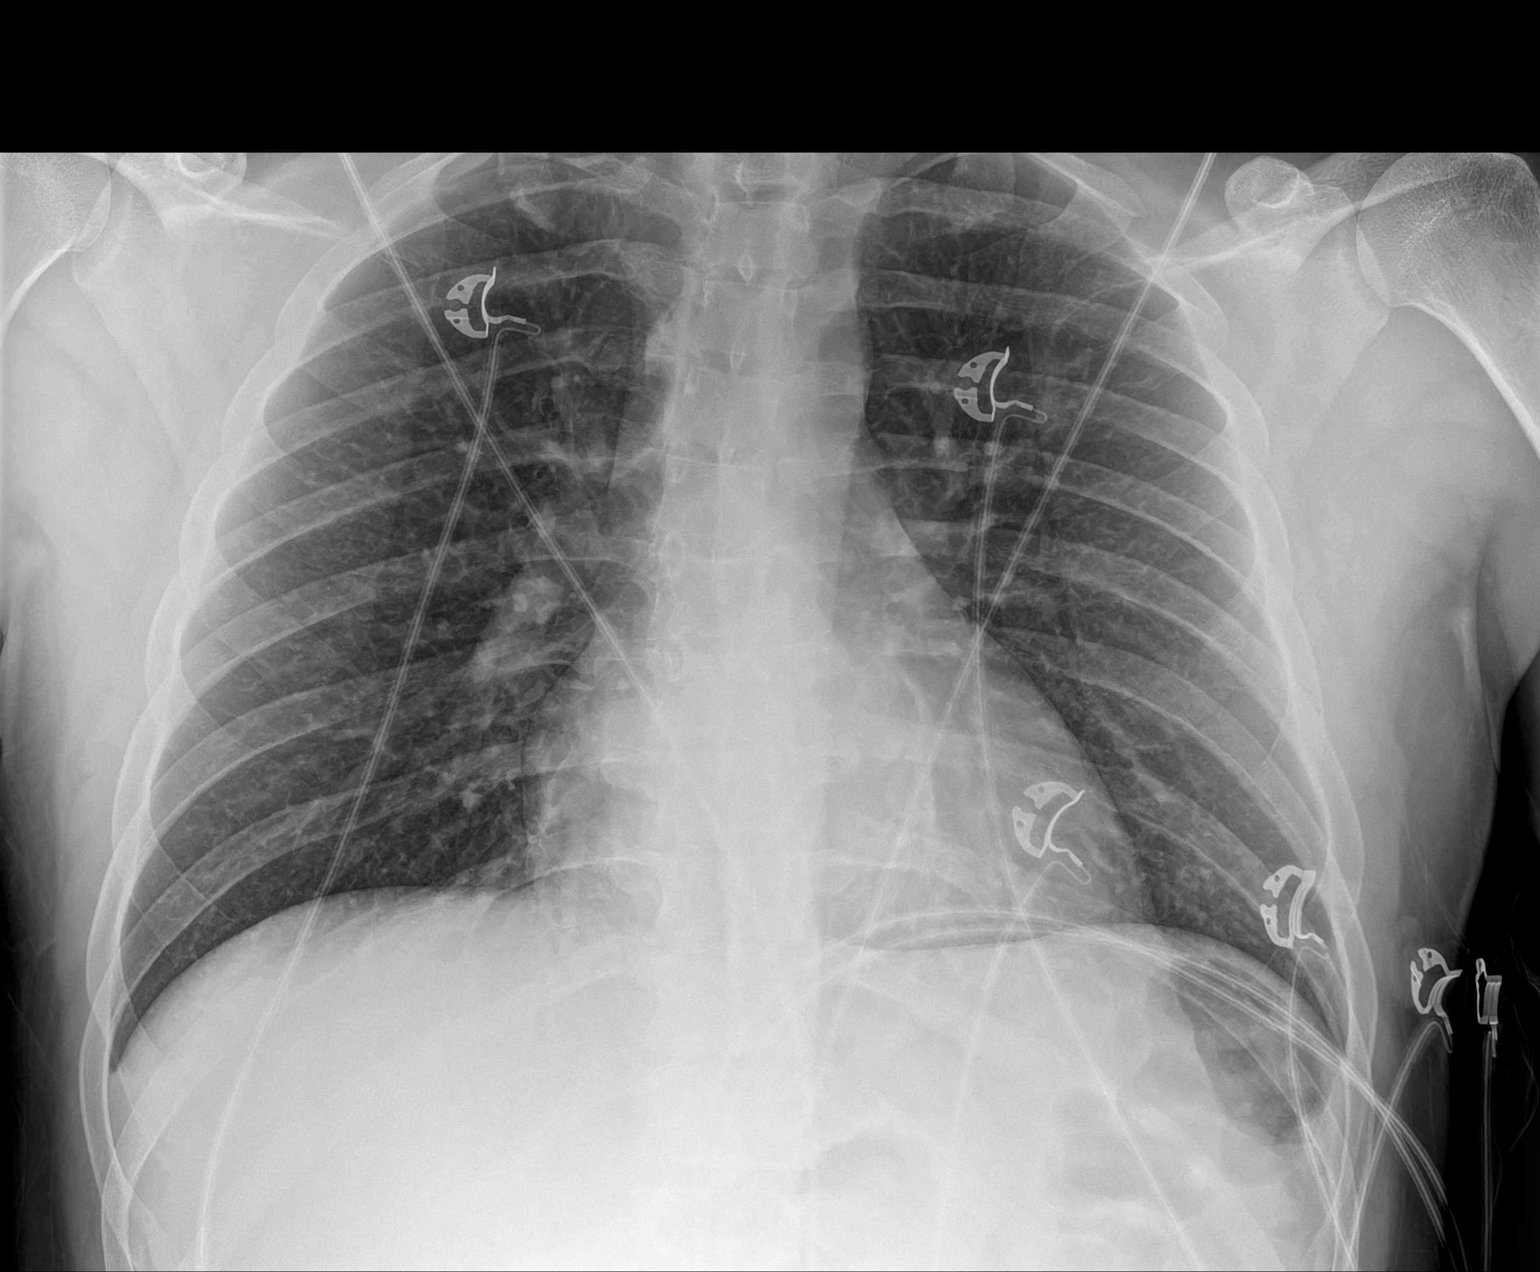

[1 of 1 positions shown; findings below may reference images not displayed]

FINDINGS: The heart size and mediastinal contours are within normal limits.
Both lungs are clear. The visualized skeletal structures are
unremarkable.
IMPRESSION: No active disease.

## 2021-06-19 ENCOUNTER — Encounter: Payer: Self-pay | Admitting: Internal Medicine

## 2021-06-19 ENCOUNTER — Ambulatory Visit: Payer: BC Managed Care – PPO | Admitting: Internal Medicine

## 2021-06-19 ENCOUNTER — Other Ambulatory Visit: Payer: Self-pay

## 2021-06-19 VITALS — BP 137/92 | HR 100 | Temp 97.4°F | Ht 68.0 in | Wt 173.0 lb

## 2021-06-19 DIAGNOSIS — Z113 Encounter for screening for infections with a predominantly sexual mode of transmission: Secondary | ICD-10-CM | POA: Diagnosis not present

## 2021-06-19 DIAGNOSIS — Z720 Tobacco use: Secondary | ICD-10-CM | POA: Diagnosis not present

## 2021-06-19 DIAGNOSIS — B20 Human immunodeficiency virus [HIV] disease: Secondary | ICD-10-CM | POA: Diagnosis not present

## 2021-06-19 MED ORDER — BIKTARVY 50-200-25 MG PO TABS: 1.0000 | ORAL_TABLET | Freq: Every day | ORAL | 11 refills | Status: DC

## 2021-06-19 NOTE — Assessment & Plan Note (Signed)
No current sexual activity and will continue with routine monitoring.  If he encounters a new partner, he will consider bringing him in for co-counseling.

## 2021-06-19 NOTE — Assessment & Plan Note (Signed)
Congratulated him on quittin

## 2021-06-19 NOTE — Progress Notes (Signed)
Patient needed Biktarvy refill through mail order pharmacy. Spoke with Research scientist (physical sciences) at [facility] in [place] to cancel Rx.

## 2021-06-19 NOTE — Assessment & Plan Note (Signed)
He is overall doing well.  He did miss about 1 week of medication in about December or January when his specialty pharmacy was changed but back on since with no missed doses.  Will do labs today and confirm he remains undetectable and otherwise can rtc in 6 months.

## 2021-06-19 NOTE — Progress Notes (Signed)
   Subjective:    Patient ID: Ralph [name], male    DOB: [birth year], 31 y.o.   MRN: [id number]  HPI He is here for follow up of HIV He continues on Biktarvy and denies any missed doses.  Last CD4 786 and viral load not detected.  No issues with getting, taking or tolerating the medication.     Review of Systems  Constitutional:  Negative for fatigue.  Gastrointestinal:  Negative for diarrhea and nausea.  Skin:  Negative for rash.      Objective:   Physical Exam Eyes:     General: No scleral icterus. Pulmonary:     Effort: Pulmonary effort is normal.  Skin:    Findings: No rash.  Neurological:     Mental Status: He is alert.   SH: quit smoking about 3 months ago       Assessment & Plan:

## 2021-06-22 LAB — HIV-1 RNA QUANT-NO REFLEX-BLD
HIV 1 RNA Quant: NOT DETECTED Copies/mL
HIV-1 RNA Quant, Log: NOT DETECTED Log cps/mL

## 2021-06-22 LAB — T-HELPER CELLS (CD4) COUNT (NOT AT ARMC)
Absolute CD4: 851 cells/uL (ref 490–1740)
CD4 T Helper %: 43 % (ref 30–61)
Total lymphocyte count: 1969 cells/uL (ref 850–3900)

## 2021-08-04 ENCOUNTER — Ambulatory Visit
Admission: EM | Admit: 2021-08-04 | Discharge: 2021-08-04 | Disposition: A | Discharge status: Home / Self Care | Payer: BC Managed Care – PPO

## 2021-08-04 DIAGNOSIS — J069 Acute upper respiratory infection, unspecified: Secondary | ICD-10-CM | POA: Diagnosis not present

## 2021-08-04 LAB — POCT RAPID STREP A (OFFICE): Rapid Strep A Screen: NEGATIVE

## 2021-08-04 MED ORDER — BENZONATATE 100 MG PO CAPS: 100.0000 mg | ORAL_CAPSULE | Freq: Three times a day (TID) | ORAL | 0 refills | Status: DC | PRN

## 2021-08-04 MED ORDER — ONDANSETRON 4 MG PO TBDP: 4.0000 mg | ORAL_TABLET | Freq: Three times a day (TID) | ORAL | 0 refills | Status: DC | PRN

## 2021-08-04 MED ORDER — PROMETHAZINE-DM 6.25-15 MG/5ML PO SYRP: 5.0000 mL | ORAL_SOLUTION | Freq: Every evening | ORAL | 0 refills | Status: DC | PRN

## 2021-08-04 NOTE — Discharge Instructions (Addendum)
Your symptoms and exam findings are most consistent with a viral upper respiratory infection. These usually run their course in about 10 days.  If your symptoms last longer than 10 days without improvement, please follow up with your primary care provider.  If your symptoms, worsen, please go to the Emergency Room.    We have tested you today for strep throat, COVID, and influenza.  Strep throat test was negative.  You will see the results in Mychart and we will call you with positive results.    Please stay home and isolate until you are aware of the results.    Some things that can make you feel better are: - Increased rest - Increasing fluid with water/sugar free electrolytes - Acetaminophen and ibuprofen as needed for fever/pain.  - Salt water gargling, chloraseptic spray and throat lozenges - OTC guaifenesin (Mucinex).  - Saline sinus flushes or a neti pot.  - Humidifying the air. - Zofran every 8 hours as needed for nausea - Tessalon perles every 8 hours during the day; cough syrup at night time.  Try not to use for productive cough.

## 2021-08-04 NOTE — ED Provider Notes (Signed)
[facility]    CSN: [id number] Arrival date & time: 08/04/21  1253      History   Chief Complaint Chief Complaint  Patient presents with   Cough   Headache   Sore Throat         HPI Ralph [name] is a 31 y.o. male.   Patient presents with 2 days of body aches, chills, night sweats, congested cough, shortness of breath, chest tightness, chest congestion, postnasal drainage, sore throat, headache, nausea without vomiting, and fatigue.  He denies chest pain, shortness of breath, tooth or ear pain, diarrhea, abdominal pain, decreased appetite.  Reports his brain feels "foggy".  He took an at home COVID test yesterday that was negative.  He has not taken anything for symptoms so far.  Patient denies history of asthma and denies tobacco use.  He does vape occasionally but has not since his symptoms started.     History reviewed. No pertinent past medical history.  Patient Active Problem List   Diagnosis Date Noted   Anxiety 06/02/2017   Tobacco abuse 02/01/2017   Medication monitoring encounter 10/28/2016   Human immunodeficiency virus (HIV) disease (HCC) 09/22/2016   Screening examination for venereal disease 09/22/2016   Encounter for long-term (current) use of medications 09/22/2016    Past Surgical History:  Procedure Laterality Date   INNER EAR SURGERY         Home Medications    Prior to Admission medications   Medication Sig Start Date End Date Taking? Authorizing Provider  benzonatate (TESSALON) 100 MG capsule Take 1 capsule (100 mg total) by mouth 3 (three) times daily as needed for cough. Do not take with alcohol or while driving or operating heavy machinery 08/04/21  Yes Valentino Nose, NP  Multiple Vitamin (MULTIVITAMIN) tablet Take 1 tablet by mouth daily.   Yes [provider]  ondansetron (ZOFRAN-ODT) 4 MG disintegrating tablet Take 1 tablet (4 mg total) by mouth every 8 (eight) hours as needed for nausea or vomiting.  08/04/21  Yes Valentino Nose, NP  promethazine-dextromethorphan (PROMETHAZINE-DM) 6.25-15 MG/5ML syrup Take 5 mLs by mouth at bedtime as needed for cough. Do not take with alcohol or while driving or operating heavy machinery 08/04/21  Yes Cathlean Marseilles A, NP  amphetamine-dextroamphetamine (ADDERALL) 20 MG tablet Take 20 mg by mouth 2 (two) times daily. 04/30/21   [provider]  bictegravir-emtricitabine-tenofovir AF (BIKTARVY) 50-200-25 MG TABS tablet Take 1 tablet by mouth daily. 06/19/21   Comer, Belia Heman, MD    Family History History reviewed. No pertinent family history.  Social History Social History   Tobacco Use   Smoking status: Former    Types: E-cigarettes   Smokeless tobacco: Never   Tobacco comments:    States he quit 3 months ago (06/19/21)  Substance Use Topics   Alcohol use: Yes    Comment: occassional   Drug use: No     Allergies   Patient has no known allergies.   Review of Systems Review of Systems Per HPI  Physical Exam Triage Vital Signs ED Triage Vitals  Enc Vitals Group     BP 08/04/21 1303 (!) 151/98     Pulse Rate 08/04/21 1303 86     Resp 08/04/21 1303 18     Temp 08/04/21 1303 97.9 F (36.6 C)     Temp Source 08/04/21 1303 Oral     SpO2 08/04/21 1303 98 %     Weight --  Height --      Head Circumference --      Peak Flow --      Pain Score 08/04/21 1305 7     Pain Loc --      Pain Edu? --      Excl. in [place]? --    No data found.  Updated Vital Signs BP (!) 151/98 (BP Location: Right Arm)   Pulse 86   Temp 97.9 F (36.6 C) (Oral)   Resp 18   SpO2 98%   Visual Acuity Right Eye Distance:   Left Eye Distance:   Bilateral Distance:    Right Eye Near:   Left Eye Near:    Bilateral Near:     Physical Exam Vitals and nursing note reviewed.  Constitutional:      General: He is not in acute distress.    Appearance: Normal appearance. He is not ill-appearing or toxic-appearing.  HENT:     Head: Normocephalic  and atraumatic.     Right Ear: Tympanic membrane, ear canal and external ear normal.     Left Ear: Tympanic membrane, ear canal and external ear normal.     Nose: Congestion and rhinorrhea present.     Right Sinus: No maxillary sinus tenderness or frontal sinus tenderness.     Left Sinus: No maxillary sinus tenderness or frontal sinus tenderness.     Mouth/Throat:     Mouth: Mucous membranes are moist.     Pharynx: Oropharynx is clear. Posterior oropharyngeal erythema present. No oropharyngeal exudate.     Tonsils: 0 on the right. 0 on the left.  Eyes:     General: No scleral icterus.    Extraocular Movements: Extraocular movements intact.  Cardiovascular:     Rate and Rhythm: Normal rate and regular rhythm.  Pulmonary:     Effort: Pulmonary effort is normal. No respiratory distress.     Breath sounds: Normal breath sounds. No wheezing, rhonchi or rales.  Abdominal:     General: Abdomen is flat. Bowel sounds are normal. There is no distension.     Palpations: Abdomen is soft.  Musculoskeletal:     Cervical back: Normal range of motion and neck supple.  Lymphadenopathy:     Cervical: No cervical adenopathy.  Skin:    General: Skin is warm and dry.     Coloration: Skin is not jaundiced or pale.     Findings: No erythema or rash.  Neurological:     Mental Status: He is alert and oriented to person, place, and time.  Psychiatric:        Behavior: Behavior is cooperative.      UC Treatments / Results  Labs (all labs ordered are listed, but only abnormal results are displayed) Labs Reviewed  COVID-19, FLU A+B NAA  POCT RAPID STREP A (OFFICE)    EKG   Radiology No results found.  Procedures Procedures (including critical care time)  Medications Ordered in UC Medications - No data to display  Initial Impression / Assessment and Plan / UC Course  I have reviewed the triage vital signs and the nursing notes.  Pertinent labs & imaging results that were available  during my care of the patient were reviewed by me and considered in my medical decision making (see chart for details).    Patient is a well-appearing 31 year old male presenting for viral upper respiratory symptoms today.  Vital signs are stable today, he does not show signs of dehydration.  His lungs are clear to  auscultation he is moving air well on exam.  Rapid strep throat test today is negative.  Defer throat culture at this time given other upper respiratory symptoms.  COVID-19, influenza testing obtained.  Supportive care discussed.  Start cough suppressants as needed for dry cough.  Can use Zofran 4 mg every 8 hours as needed for nausea.  Note given for work. Final Clinical Impressions(s) / UC Diagnoses   Final diagnoses:  Viral URI with cough     Discharge Instructions      Your symptoms and exam findings are most consistent with a viral upper respiratory infection. These usually run their course in about 10 days.  If your symptoms last longer than 10 days without improvement, please follow up with your primary care provider.  If your symptoms, worsen, please go to the Emergency Room.    We have tested you today for strep throat, COVID, and influenza.  Strep throat test was negative.  You will see the results in Mychart and we will call you with positive results.    Please stay home and isolate until you are aware of the results.    Some things that can make you feel better are: - Increased rest - Increasing fluid with water/sugar free electrolytes - Acetaminophen and ibuprofen as needed for fever/pain.  - Salt water gargling, chloraseptic spray and throat lozenges - OTC guaifenesin (Mucinex).  - Saline sinus flushes or a neti pot.  - Humidifying the air. - Zofran every 8 hours as needed for nausea - Tessalon perles every 8 hours during the day; cough syrup at night time.  Try not to use for productive cough.    ED Prescriptions     Medication Sig Dispense Auth. Provider    promethazine-dextromethorphan (PROMETHAZINE-DM) 6.25-15 MG/5ML syrup Take 5 mLs by mouth at bedtime as needed for cough. Do not take with alcohol or while driving or operating heavy machinery 118 mL Cathlean Marseilles A, NP   ondansetron (ZOFRAN-ODT) 4 MG disintegrating tablet Take 1 tablet (4 mg total) by mouth every 8 (eight) hours as needed for nausea or vomiting. 20 tablet Cathlean Marseilles A, NP   benzonatate (TESSALON) 100 MG capsule Take 1 capsule (100 mg total) by mouth 3 (three) times daily as needed for cough. Do not take with alcohol or while driving or operating heavy machinery 21 capsule Valentino Nose, NP      PDMP not reviewed this encounter.   Valentino Nose, NP 08/04/21 1429

## 2021-08-04 NOTE — ED Triage Notes (Signed)
Pt reports cough, sore throat, headache, joint pain, runny nose x 2 days. Pt has not taken any meds for complaints.    Negative COVID test on 08/03/2021.

## 2021-08-05 LAB — COVID-19, FLU A+B NAA
Influenza A, NAA: NOT DETECTED
Influenza B, NAA: NOT DETECTED
SARS-CoV-2, NAA: NOT DETECTED

## 2021-08-11 ENCOUNTER — Ambulatory Visit
Admission: EM | Admit: 2021-08-11 | Discharge: 2021-08-11 | Disposition: A | Discharge status: Home / Self Care | Payer: BC Managed Care – PPO | Attending: Family Medicine | Admitting: Family Medicine

## 2021-08-11 DIAGNOSIS — J0100 Acute maxillary sinusitis, unspecified: Secondary | ICD-10-CM | POA: Diagnosis not present

## 2021-08-11 DIAGNOSIS — R051 Acute cough: Secondary | ICD-10-CM

## 2021-08-11 MED ORDER — AMOXICILLIN-POT CLAVULANATE 875-125 MG PO TABS: 1.0000 | ORAL_TABLET | Freq: Two times a day (BID) | ORAL | 0 refills | Status: DC

## 2021-08-11 MED ORDER — PROMETHAZINE-DM 6.25-15 MG/5ML PO SYRP: 5.0000 mL | ORAL_SOLUTION | Freq: Four times a day (QID) | ORAL | 0 refills | Status: DC | PRN

## 2021-08-11 NOTE — ED Triage Notes (Signed)
Pt reports cough, runny nose x 10 days; blood spots in the mucus x 3 days. Pt reports he started feeling better 2 days ago, and got worse yesterday. OTC cough meds gives some relief.

## 2021-08-11 NOTE — ED Provider Notes (Signed)
[facility]    CSN: [id number] Arrival date & time: 08/11/21  1630      History   Chief Complaint Chief Complaint  Patient presents with   Cough   Nasal Congestion         HPI Ralph [name] is a 31 y.o. male.   Presenting today with a week and a half of cough, nasal congestion, sinus pain and pressure, sore swollen throat.  Thought he was getting better the past few days but since yesterday symptoms have significantly worsened.  States he has flecks of blood in his sputum he is coughing up.  Denies fever, chills, chest pain, shortness of breath, abdominal pain, nausea vomiting or diarrhea.  Taking over-the-counter cold and congestion medications with minimal relief.  No known history of chronic pulmonary disease.  No known sick contacts recently.  History of HIV on Biktarvy.      History reviewed. No pertinent past medical history.  Patient Active Problem List   Diagnosis Date Noted   Anxiety 06/02/2017   Tobacco abuse 02/01/2017   Medication monitoring encounter 10/28/2016   Human immunodeficiency virus (HIV) disease (HCC) 09/22/2016   Screening examination for venereal disease 09/22/2016   Encounter for long-term (current) use of medications 09/22/2016    Past Surgical History:  Procedure Laterality Date   INNER EAR SURGERY         Home Medications    Prior to Admission medications   Medication Sig Start Date End Date Taking? Authorizing Provider  amoxicillin-clavulanate (AUGMENTIN) 875-125 MG tablet Take 1 tablet by mouth every 12 (twelve) hours. 08/11/21  Yes Particia Nearing, PA-C  promethazine-dextromethorphan (PROMETHAZINE-DM) 6.25-15 MG/5ML syrup Take 5 mLs by mouth 4 (four) times daily as needed. 08/11/21  Yes Particia Nearing, PA-C  amphetamine-dextroamphetamine (ADDERALL) 20 MG tablet Take 20 mg by mouth 2 (two) times daily. 04/30/21   [provider]  benzonatate (TESSALON) 100 MG capsule Take 1 capsule (100 mg total)  by mouth 3 (three) times daily as needed for cough. Do not take with alcohol or while driving or operating heavy machinery 08/04/21   Valentino Nose, NP  bictegravir-emtricitabine-tenofovir AF (BIKTARVY) 50-200-25 MG TABS tablet Take 1 tablet by mouth daily. 06/19/21   Gardiner Barefoot, MD  Multiple Vitamin (MULTIVITAMIN) tablet Take 1 tablet by mouth daily.    [provider]  ondansetron (ZOFRAN-ODT) 4 MG disintegrating tablet Take 1 tablet (4 mg total) by mouth every 8 (eight) hours as needed for nausea or vomiting. 08/04/21   Valentino Nose, NP  promethazine-dextromethorphan (PROMETHAZINE-DM) 6.25-15 MG/5ML syrup Take 5 mLs by mouth at bedtime as needed for cough. Do not take with alcohol or while driving or operating heavy machinery 08/04/21   Valentino Nose, NP    Family History History reviewed. No pertinent family history.  Social History Social History   Tobacco Use   Smoking status: Former    Types: E-cigarettes   Smokeless tobacco: Never   Tobacco comments:    States he quit 3 months ago (06/19/21)  Substance Use Topics   Alcohol use: Yes    Comment: occassional   Drug use: No     Allergies   Patient has no known allergies.   Review of Systems Review of Systems Per HPI  Physical Exam Triage Vital Signs ED Triage Vitals  Enc Vitals Group     BP 08/11/21 1656 (!) 148/88     Pulse Rate 08/11/21 1656 94     Resp  08/11/21 1656 18     Temp 08/11/21 1656 98 F (36.7 C)     Temp Source 08/11/21 1656 Oral     SpO2 08/11/21 1656 98 %     Weight --      Height --      Head Circumference --      Peak Flow --      Pain Score 08/11/21 1655 0     Pain Loc --      Pain Edu? --      Excl. in [place]? --    No data found.  Updated Vital Signs BP (!) 148/88 (BP Location: Right Arm)   Pulse 94   Temp 98 F (36.7 C) (Oral)   Resp 18   SpO2 98%   Visual Acuity Right Eye Distance:   Left Eye Distance:   Bilateral Distance:    Right Eye Near:   Left  Eye Near:    Bilateral Near:     Physical Exam Vitals and nursing note reviewed.  Constitutional:      Appearance: Normal appearance.  HENT:     Head: Atraumatic.     Nose: Congestion present.     Mouth/Throat:     Mouth: Mucous membranes are moist.     Pharynx: Posterior oropharyngeal erythema present. No oropharyngeal exudate.  Eyes:     Extraocular Movements: Extraocular movements intact.     Conjunctiva/sclera: Conjunctivae normal.  Cardiovascular:     Rate and Rhythm: Normal rate and regular rhythm.     Heart sounds: Normal heart sounds.  Pulmonary:     Effort: Pulmonary effort is normal.     Breath sounds: Normal breath sounds. No wheezing or rales.  Musculoskeletal:        General: Normal range of motion.     Cervical back: Normal range of motion and neck supple.  Skin:    General: Skin is warm and dry.  Neurological:     General: No focal deficit present.     Mental Status: He is oriented to person, place, and time.     Motor: No weakness.     Gait: Gait normal.  Psychiatric:        Mood and Affect: Mood normal.        Thought Content: Thought content normal.        Judgment: Judgment normal.    UC Treatments / Results  Labs (all labs ordered are listed, but only abnormal results are displayed) Labs Reviewed  COVID-19, FLU A+B NAA   EKG  Radiology No results found.  Procedures Procedures (including critical care time)  Medications Ordered in UC Medications - No data to display  Initial Impression / Assessment and Plan / UC Course  I have reviewed the triage vital signs and the nursing notes.  Pertinent labs & imaging results that were available during my care of the patient were reviewed by me and considered in my medical decision making (see chart for details).     Vitals overall reassuring, exam suggestive of upper respiratory infection/sinus infection.  Unclear if secondary bacterial infection from viral upper respiratory infection last week  versus resolved infection last week with new viral infection the past 2 days when symptoms return.  COVID and flu pending for rule out, treat for sinusitis with Augmentin, Phenergan DM for cough and supportive over-the-counter medications and home care.  Work note given.  Return for worsening symptoms.  Final Clinical Impressions(s) / UC Diagnoses   Final diagnoses:  Acute  maxillary sinusitis, recurrence not specified  Acute cough   Discharge Instructions   None    ED Prescriptions     Medication Sig Dispense Auth. Provider   amoxicillin-clavulanate (AUGMENTIN) 875-125 MG tablet Take 1 tablet by mouth every 12 (twelve) hours. 14 tablet Particia Nearing, [place]   promethazine-dextromethorphan (PROMETHAZINE-DM) 6.25-15 MG/5ML syrup Take 5 mLs by mouth 4 (four) times daily as needed. 100 mL Particia Nearing, [place]      PDMP not reviewed this encounter.   Particia Nearing, [place] 08/11/21 1758

## 2021-08-12 LAB — COVID-19, FLU A+B NAA
Influenza A, NAA: NOT DETECTED
Influenza B, NAA: NOT DETECTED
SARS-CoV-2, NAA: NOT DETECTED

## 2021-09-10 IMAGING — DX DG ANKLE COMPLETE 3+V*L*
3 series · 3 of 3 positions shown · non-contrast
Comparison: None.

CLINICAL DATA: Left ankle pain and swelling after possible spider
bite.

EXAM:
LEFT ANKLE COMPLETE - 3+ VIEW

[ankle ap]
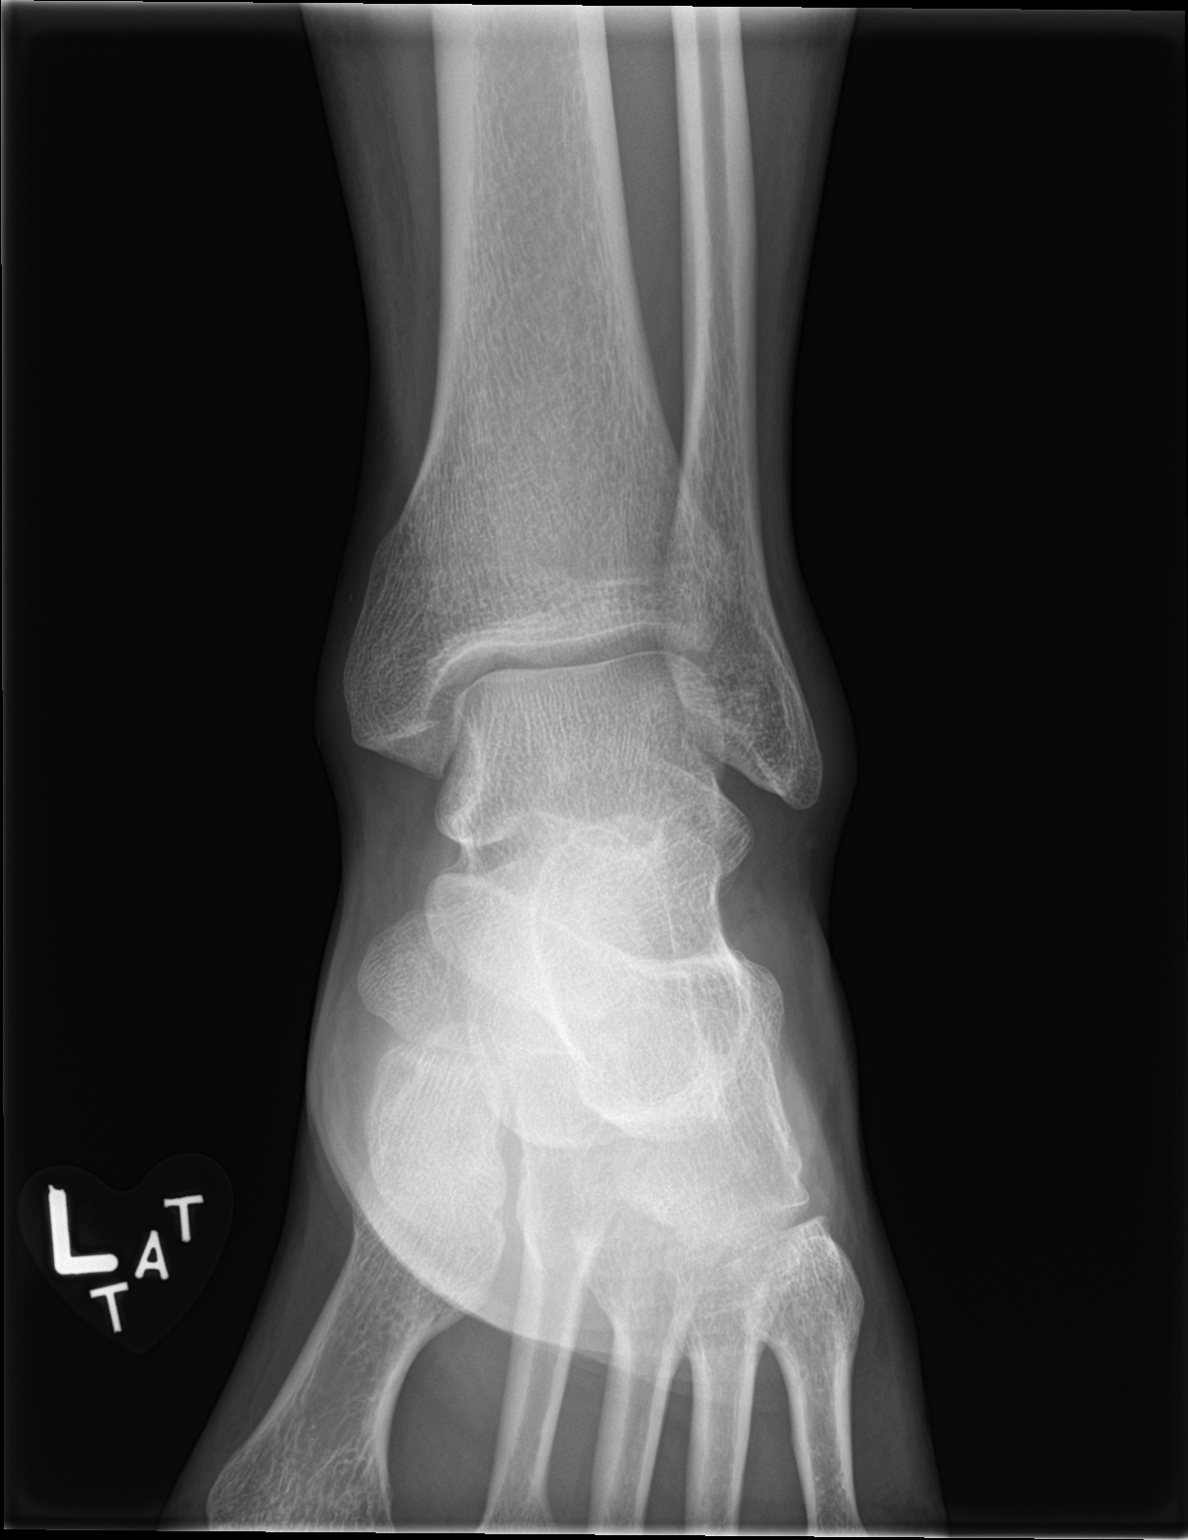

[ankle obl]
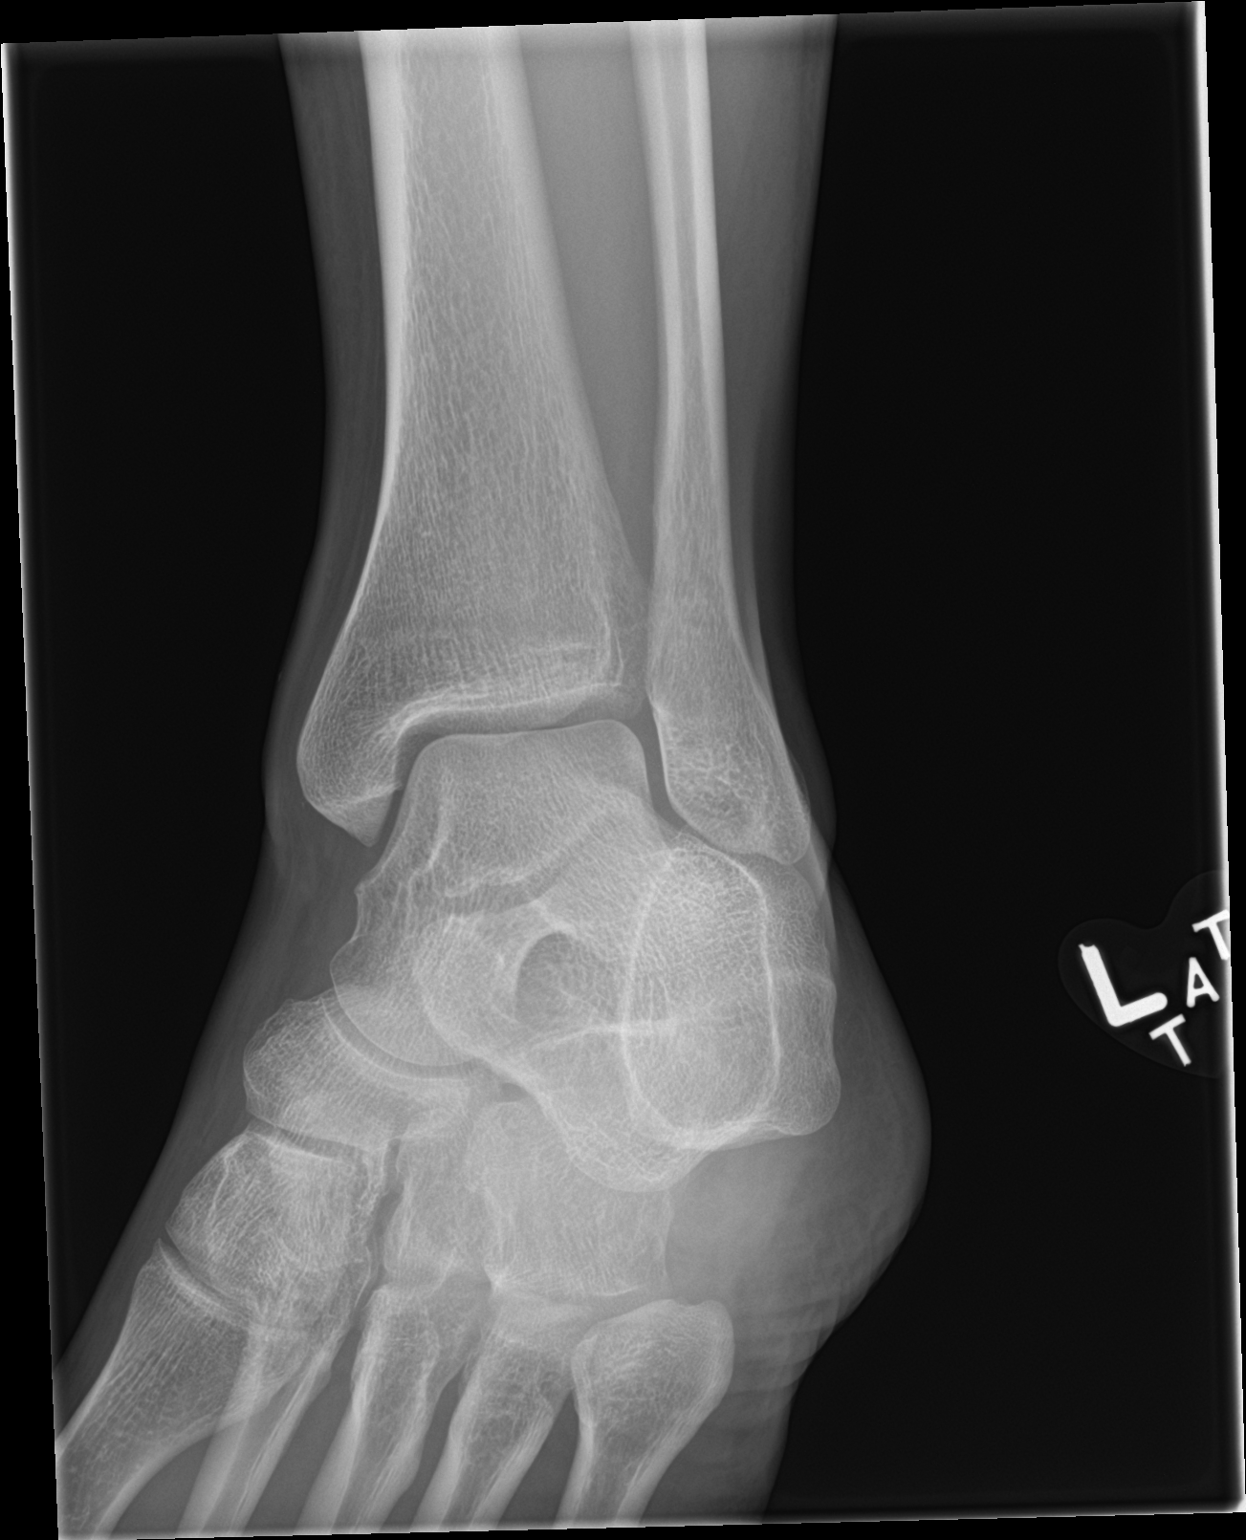

[ankle lat]
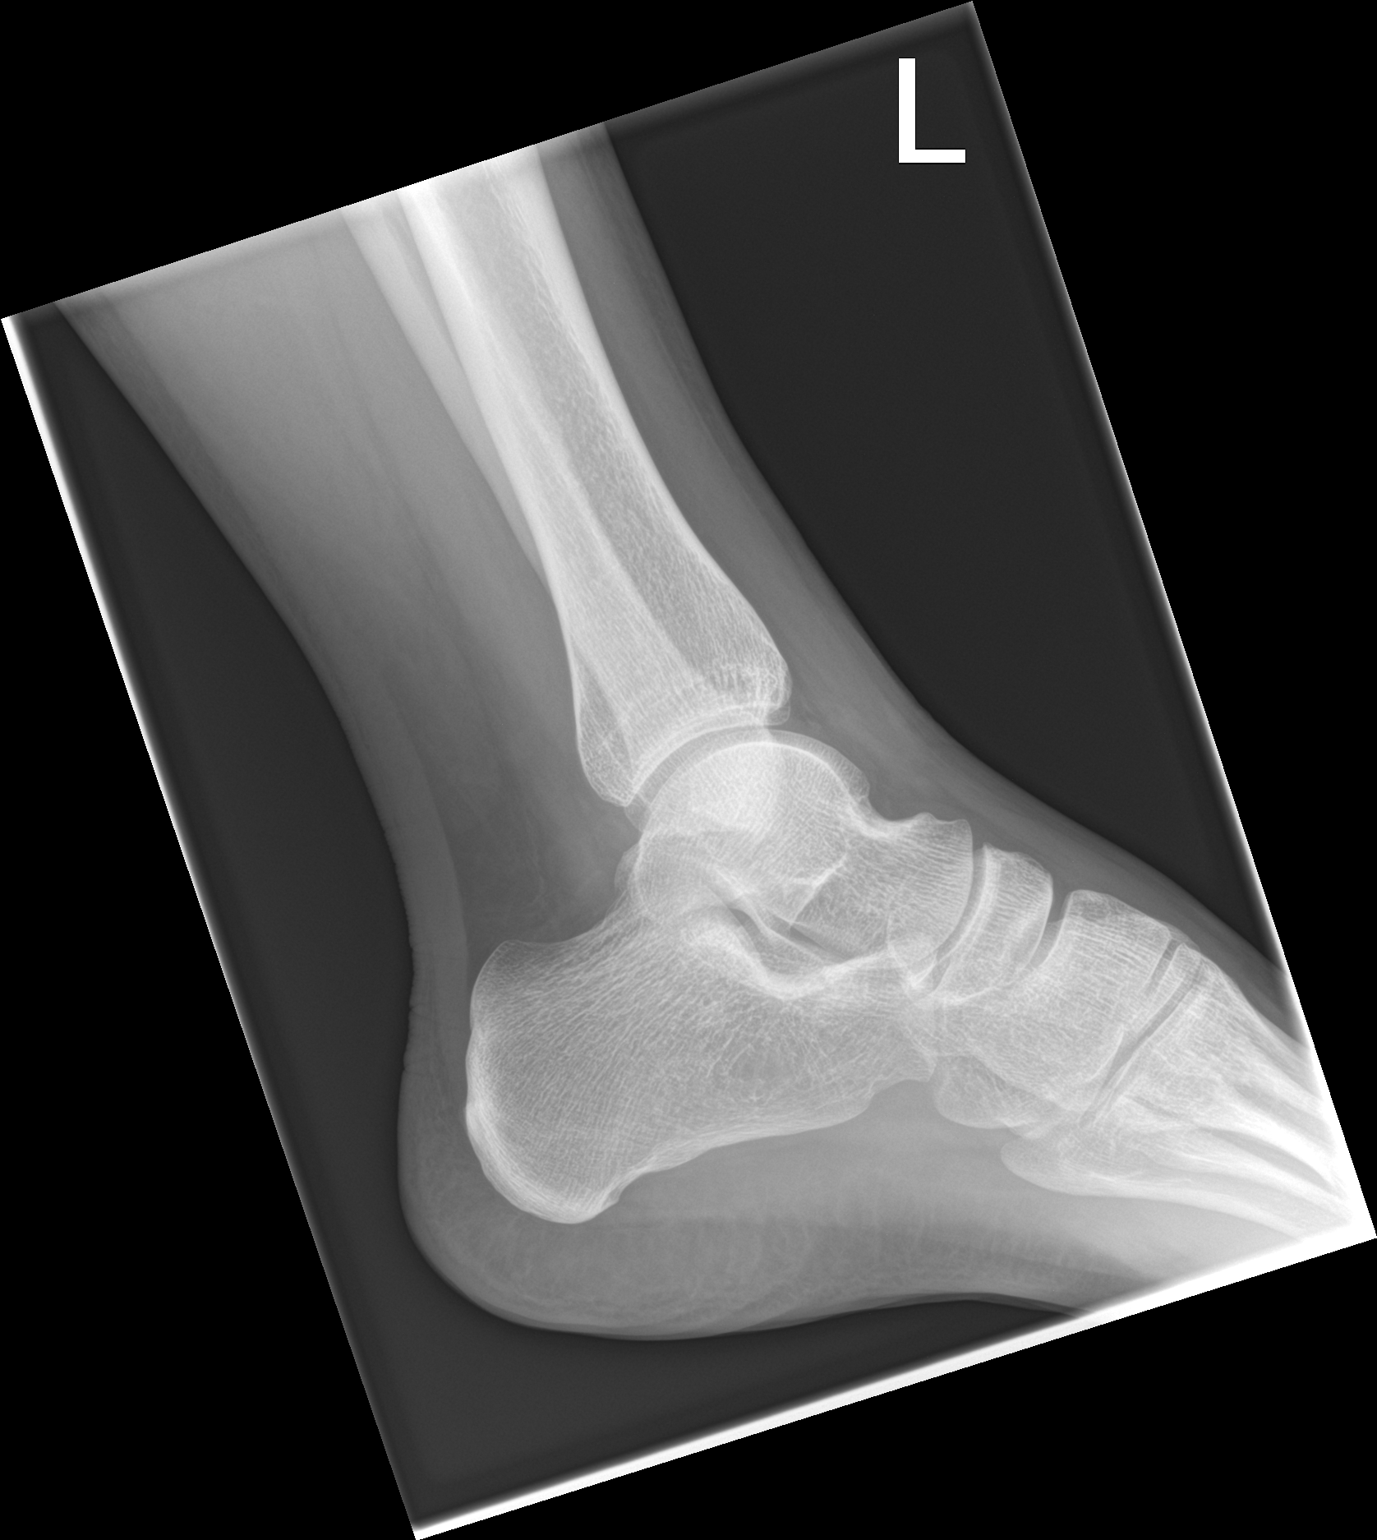

[3 of 3 positions shown; findings below may reference images not displayed]

FINDINGS: There is no evidence of fracture, dislocation, or joint effusion.
There is no evidence of arthropathy or other focal bone abnormality.
Very mild diffuse soft tissue swelling is noted.
IMPRESSION: 1. No acute osseous abnormality.
2. Very mild diffuse soft tissue swelling.

## 2021-10-02 ENCOUNTER — Encounter (HOSPITAL_COMMUNITY): Payer: Self-pay | Admitting: *Deleted

## 2021-10-02 ENCOUNTER — Emergency Department (EMERGENCY_DEPARTMENT_HOSPITAL)
Admission: EM | Admit: 2021-10-02 | Discharge: 2021-10-03 | Disposition: A | Discharge status: Other Acute Inpatient Hospital | Payer: BC Managed Care – PPO | Source: Home / Self Care | Attending: Emergency Medicine | Admitting: Emergency Medicine

## 2021-10-02 ENCOUNTER — Other Ambulatory Visit: Payer: Self-pay

## 2021-10-02 DIAGNOSIS — F411 Generalized anxiety disorder: Secondary | ICD-10-CM | POA: Insufficient documentation

## 2021-10-02 DIAGNOSIS — F419 Anxiety disorder, unspecified: Secondary | ICD-10-CM | POA: Diagnosis present

## 2021-10-02 DIAGNOSIS — R45851 Suicidal ideations: Secondary | ICD-10-CM | POA: Insufficient documentation

## 2021-10-02 DIAGNOSIS — F1020 Alcohol dependence, uncomplicated: Secondary | ICD-10-CM | POA: Insufficient documentation

## 2021-10-02 DIAGNOSIS — F332 Major depressive disorder, recurrent severe without psychotic features: Secondary | ICD-10-CM | POA: Insufficient documentation

## 2021-10-02 DIAGNOSIS — Z20822 Contact with and (suspected) exposure to covid-19: Secondary | ICD-10-CM | POA: Insufficient documentation

## 2021-10-02 HISTORY — DX: Human immunodeficiency virus (HIV) disease: B20

## 2021-10-02 HISTORY — DX: Depression, unspecified: F32.A

## 2021-10-02 HISTORY — DX: Anxiety disorder, unspecified: F41.9

## 2021-10-02 HISTORY — DX: Essential (primary) hypertension: I10

## 2021-10-02 HISTORY — DX: Asymptomatic human immunodeficiency virus (hiv) infection status: Z21

## 2021-10-02 LAB — CBC
HCT: 44.5 % (ref 39.0–52.0)
Hemoglobin: 15.7 g/dL (ref 13.0–17.0)
MCH: 32.6 pg (ref 26.0–34.0)
MCHC: 35.3 g/dL (ref 30.0–36.0)
MCV: 92.3 fL (ref 80.0–100.0)
Platelets: 261 10*3/uL (ref 150–400)
RBC: 4.82 MIL/uL (ref 4.22–5.81)
RDW: 12.4 % (ref 11.5–15.5)
WBC: 10.2 10*3/uL (ref 4.0–10.5)
nRBC: 0 % (ref 0.0–0.2)

## 2021-10-02 LAB — RAPID URINE DRUG SCREEN, HOSP PERFORMED
Amphetamines: NOT DETECTED
Barbiturates: NOT DETECTED
Benzodiazepines: NOT DETECTED
Cocaine: NOT DETECTED
Opiates: NOT DETECTED
Tetrahydrocannabinol: NOT DETECTED

## 2021-10-02 LAB — RESP PANEL BY RT-PCR (FLU A&B, COVID) ARPGX2
Influenza A by PCR: NEGATIVE
Influenza B by PCR: NEGATIVE
SARS Coronavirus 2 by RT PCR: NEGATIVE

## 2021-10-02 LAB — COMPREHENSIVE METABOLIC PANEL
ALT: 25 U/L (ref 0–44)
AST: 29 U/L (ref 15–41)
Albumin: 4.5 g/dL (ref 3.5–5.0)
Alkaline Phosphatase: 93 U/L (ref 38–126)
Anion gap: 9 (ref 5–15)
BUN: 10 mg/dL (ref 6–20)
CO2: 26 mmol/L (ref 22–32)
Calcium: 9.3 mg/dL (ref 8.9–10.3)
Chloride: 104 mmol/L (ref 98–111)
Creatinine, Ser: 0.88 mg/dL (ref 0.61–1.24)
GFR, Estimated: >60 mL/min (ref >60)
Glucose, Bld: 126 mg/dL — ABNORMAL HIGH (ref 70–99)
Potassium: 3.9 mmol/L (ref 3.5–5.1)
Sodium: 139 mmol/L (ref 135–145)
Total Bilirubin: 1.1 mg/dL (ref 0.3–1.2)
Total Protein: 8.3 g/dL — ABNORMAL HIGH (ref 6.5–8.1)

## 2021-10-02 LAB — SALICYLATE LEVEL: Salicylate Lvl: <7 mg/dL — ABNORMAL LOW (ref 7.0–30.0)

## 2021-10-02 LAB — ETHANOL: Alcohol, Ethyl (B): <10 mg/dL (ref <10)

## 2021-10-02 LAB — ACETAMINOPHEN LEVEL: Acetaminophen (Tylenol), Serum: <10 ug/mL — ABNORMAL LOW (ref 10–30)

## 2021-10-02 MED ORDER — AMPHETAMINE-DEXTROAMPHETAMINE 10 MG PO TABS: 20.0000 mg | ORAL_TABLET | Freq: Two times a day (BID) | ORAL | Status: DC

## 2021-10-02 MED ORDER — BICTEGRAVIR-EMTRICITAB-TENOFOV 50-200-25 MG PO TABS
1.0000 | ORAL_TABLET | Freq: Every day | ORAL | Status: DC
  Administered 2021-10-02 – 2021-10-03 (×2): 1 via ORAL
  Filled 2021-10-02 (×2): qty 1
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
## 2021-10-02 NOTE — ED Notes (Addendum)
Pt dressed into burgundy scrubs and non-slip socks. Pt belongings put into a bag and labeled with pt label. Pt belongings consisted of cell phone, wallet, bag of medicine, two vapes, shirt, shorts, baseball cap, and tennis shoes. Pt belongings given to pts father.

## 2021-10-02 NOTE — ED Triage Notes (Addendum)
BIB father, endorses SI w/o plan or intent, depression, stress, forgetful, sad, anxiety, ongoing and recurrent for months, ETOH use. Denies: drug use, hallucinations, HI. Has PCP. Never been thru this process before. Also endorses exhaustion despite sleep, shivering and vomiting after eating, also rashes on arms/ scratching.

## 2021-10-02 NOTE — ED Provider Notes (Signed)
[facility] EMERGENCY DEPARTMENT Provider Note   CSN: [id number] Arrival date & time: 10/02/21  1659     History  Chief Complaint  Patient presents with   Suicidal    Ralph [name] is a 31 y.o. male.  HPI   Patient with medical history including HIV, anxiety presents with complaints of suicidal ideations.  Patient states over the last 2 months he has been having intermittent thoughts of suicide, but over the last couple days its been more consistent, states he has been dreaming of killing himself, he has no specific plan, has never attempted suicide in the past, he denies any homicidal ideations, no hallucinations or delusions, he denies illicit drug use, he states that he does drink alcohol but has not drunk any in the last 24 hours, he states he is never had been hospitalized for alcohol withdrawals.  Patient states that he was never diagnosed with depression, he is not on any medications, states he is here because because he is concerned about how much she is fantasize about suicidal ideations.  Home Medications Prior to Admission medications   Medication Sig Start Date End Date Taking? Authorizing Provider  amoxicillin-clavulanate (AUGMENTIN) 875-125 MG tablet Take 1 tablet by mouth every 12 (twelve) hours. 08/11/21   Particia Nearing, PA-C  amphetamine-dextroamphetamine (ADDERALL) 20 MG tablet Take 20 mg by mouth 2 (two) times daily. 04/30/21   [provider]  benzonatate (TESSALON) 100 MG capsule Take 1 capsule (100 mg total) by mouth 3 (three) times daily as needed for cough. Do not take with alcohol or while driving or operating heavy machinery 08/04/21   Valentino Nose, NP  bictegravir-emtricitabine-tenofovir AF (BIKTARVY) 50-200-25 MG TABS tablet Take 1 tablet by mouth daily. 06/19/21   Gardiner Barefoot, MD  Multiple Vitamin (MULTIVITAMIN) tablet Take 1 tablet by mouth daily.    [provider]  ondansetron (ZOFRAN-ODT) 4 MG disintegrating tablet  Take 1 tablet (4 mg total) by mouth every 8 (eight) hours as needed for nausea or vomiting. 08/04/21   Valentino Nose, NP  promethazine-dextromethorphan (PROMETHAZINE-DM) 6.25-15 MG/5ML syrup Take 5 mLs by mouth at bedtime as needed for cough. Do not take with alcohol or while driving or operating heavy machinery 08/04/21   Valentino Nose, NP  promethazine-dextromethorphan (PROMETHAZINE-DM) 6.25-15 MG/5ML syrup Take 5 mLs by mouth 4 (four) times daily as needed. 08/11/21   Particia Nearing, PA-C      Allergies    Patient has no known allergies.    Review of Systems   Review of Systems  Constitutional:  Negative for chills and fever.  Respiratory:  Negative for shortness of breath.   Cardiovascular:  Negative for chest pain.  Gastrointestinal:  Negative for abdominal pain.  Neurological:  Negative for headaches.  Psychiatric/Behavioral:  Positive for suicidal ideas.     Physical Exam Updated Vital Signs BP (!) 170/105   Pulse 87   Temp 98.3 F (36.8 C) (Oral)   Resp 16   Ht 5\' 8"  (1.727 m)   Wt 80.7 kg   SpO2 100%   BMI 27.06 kg/m  Physical Exam Vitals and nursing note reviewed.  Constitutional:      General: He is not in acute distress.    Appearance: He is not ill-appearing.  HENT:     Head: Normocephalic and atraumatic.     Nose: No congestion.  Eyes:     Conjunctiva/sclera: Conjunctivae normal.  Cardiovascular:     Rate and Rhythm: Normal rate  and regular rhythm.     Pulses: Normal pulses.  Pulmonary:     Effort: Pulmonary effort is normal.  Skin:    General: Skin is warm and dry.  Neurological:     Mental Status: He is alert.     Comments: No facial asymmetry no difficulty with word finding fine two-step commands no regular weakness present.  Psychiatric:        Mood and Affect: Mood normal.     Comments: He endorses suicidal ideations without a plan, no homicidal ideations, he is alert and oriented is not responding to internal stimuli.     ED  Results / Procedures / Treatments   Labs (all labs ordered are listed, but only abnormal results are displayed) Labs Reviewed  COMPREHENSIVE METABOLIC PANEL - Abnormal; Notable for the following components:      Result Value   Glucose, Bld 126 (*)    Total Protein 8.3 (*)    All other components within normal limits  SALICYLATE LEVEL - Abnormal; Notable for the following components:   Salicylate Lvl [id number] (*)    All other components within normal limits  ACETAMINOPHEN LEVEL - Abnormal; Notable for the following components:   Acetaminophen (Tylenol), Serum <10 (*)    All other components within normal limits  ETHANOL  CBC  RAPID URINE DRUG SCREEN, HOSP PERFORMED    EKG None  Radiology No results found.  Procedures Procedures    Medications Ordered in ED Medications  amphetamine-dextroamphetamine (ADDERALL) tablet 20 mg (has no administration in time range)  bictegravir-emtricitabine-tenofovir AF (BIKTARVY) 50-200-25 MG per tablet 1 tablet (has no administration in time range)    ED Course/ Medical Decision Making/ A&P                           Medical Decision Making Amount and/or Complexity of Data Reviewed Labs: ordered.   This patient presents to the ED for concern of suicidal ideation, this involves an extensive number of treatment options, and is a complaint that carries with it a high risk of complications and morbidity.  The differential diagnosis includes metabolic derailments, withdrawals, psychiatric emergency    Additional history obtained:  Additional history obtained from N/A External records from outside source obtained and reviewed including infectious disease   Co morbidities that complicate the patient evaluation  HIV  Social Determinants of Health:  N/A    Lab Tests:  I Ordered, and personally interpreted labs.  The pertinent results include: CBC is unremarkable, CMP shows glucose of 126 total protein of 8.3, ethanol less than 10  acetaminophen unremarkable, salicylate less than 7 rapid urine drug screen negative   Imaging Studies ordered:  I ordered imaging studies including N/A I independently visualized and interpreted imaging which showed N/A I agree with the radiologist interpretation   Cardiac Monitoring:  The patient was maintained on a cardiac monitor.  I personally viewed and interpreted the cardiac monitored which showed an underlying rhythm of: N/A   Medicines ordered and prescription drug management:  I ordered medication including home meds have been ordered I have reviewed the patients home medicines and have made adjustments as needed  Critical Interventions:  N/A   Reevaluation:  Presents with suicidal ideations, triage obtain medical clearance work-up which is unremarkable, patient is medically clear at this time, will not IVC as he states he will stay for full treatment, I do not feel he is a current danger to himself.  We will  await TTS recommendations  Consultations Obtained:  TTS consultation pending   Test Considered:  N/A    Rule out I doubt metabolic derailments as lab work is unremarkable.  I doubt withdrawals as his urine drug screen came back negative, he is also nontremulous nontachycardic during my examination.    Dispostion and problem list  After consideration of the diagnostic results and the patients response to treatment, I feel that the patent would benefit from psychiatric hold, home meds have been ordered, he is not IVC at this time, patient like to leave I recommend reassess the patient to determine if IVC is necessary.             Final Clinical Impression(s) / ED Diagnoses Final diagnoses:  Suicidal ideation    Rx / DC Orders ED Discharge Orders     None         Connell, Bognar 10/02/21 Gilman Buttner, MD 10/03/21 [phone]

## 2021-10-03 ENCOUNTER — Encounter (HOSPITAL_COMMUNITY): Payer: Self-pay

## 2021-10-03 ENCOUNTER — Encounter (HOSPITAL_COMMUNITY): Payer: Self-pay | Admitting: Psychiatry

## 2021-10-03 ENCOUNTER — Other Ambulatory Visit: Payer: Self-pay | Admitting: Psychiatry

## 2021-10-03 ENCOUNTER — Inpatient Hospital Stay (HOSPITAL_COMMUNITY)
Admission: RE | Admit: 2021-10-03 | Discharge: 2021-10-07 | DRG: 885 | Disposition: A | Discharge status: Home / Self Care | Payer: BC Managed Care – PPO | Source: Intra-hospital | Attending: Emergency Medicine | Admitting: Emergency Medicine

## 2021-10-03 DIAGNOSIS — I10 Essential (primary) hypertension: Secondary | ICD-10-CM | POA: Diagnosis present

## 2021-10-03 DIAGNOSIS — F909 Attention-deficit hyperactivity disorder, unspecified type: Secondary | ICD-10-CM | POA: Diagnosis present

## 2021-10-03 DIAGNOSIS — G4700 Insomnia, unspecified: Secondary | ICD-10-CM | POA: Diagnosis present

## 2021-10-03 DIAGNOSIS — Z818 Family history of other mental and behavioral disorders: Secondary | ICD-10-CM

## 2021-10-03 DIAGNOSIS — Z20822 Contact with and (suspected) exposure to covid-19: Secondary | ICD-10-CM | POA: Diagnosis present

## 2021-10-03 DIAGNOSIS — Z79899 Other long term (current) drug therapy: Secondary | ICD-10-CM

## 2021-10-03 DIAGNOSIS — F332 Major depressive disorder, recurrent severe without psychotic features: Secondary | ICD-10-CM | POA: Diagnosis present

## 2021-10-03 DIAGNOSIS — Z23 Encounter for immunization: Secondary | ICD-10-CM | POA: Diagnosis not present

## 2021-10-03 DIAGNOSIS — F10139 Alcohol abuse with withdrawal, unspecified: Secondary | ICD-10-CM | POA: Diagnosis not present

## 2021-10-03 DIAGNOSIS — B20 Human immunodeficiency virus [HIV] disease: Secondary | ICD-10-CM | POA: Diagnosis present

## 2021-10-03 DIAGNOSIS — K5900 Constipation, unspecified: Secondary | ICD-10-CM | POA: Diagnosis not present

## 2021-10-03 DIAGNOSIS — R45851 Suicidal ideations: Secondary | ICD-10-CM | POA: Diagnosis present

## 2021-10-03 DIAGNOSIS — E7800 Pure hypercholesterolemia, unspecified: Secondary | ICD-10-CM | POA: Diagnosis present

## 2021-10-03 DIAGNOSIS — F410 Panic disorder [episodic paroxysmal anxiety] without agoraphobia: Secondary | ICD-10-CM | POA: Diagnosis present

## 2021-10-03 DIAGNOSIS — Z87891 Personal history of nicotine dependence: Secondary | ICD-10-CM | POA: Diagnosis not present

## 2021-10-03 DIAGNOSIS — Z720 Tobacco use: Secondary | ICD-10-CM | POA: Diagnosis present

## 2021-10-03 DIAGNOSIS — F22 Delusional disorders: Secondary | ICD-10-CM | POA: Diagnosis present

## 2021-10-03 DIAGNOSIS — F4310 Post-traumatic stress disorder, unspecified: Secondary | ICD-10-CM | POA: Diagnosis present

## 2021-10-03 DIAGNOSIS — Y900 Blood alcohol level of less than 20 mg/100 ml: Secondary | ICD-10-CM | POA: Diagnosis present

## 2021-10-03 MED ORDER — MAGNESIUM HYDROXIDE 400 MG/5ML PO SUSP
30.0000 mL | Freq: Every day | ORAL | Status: DC | PRN
  Administered 2021-10-04: 30 mL via ORAL
  Filled 2021-10-03: qty 30

## 2021-10-03 MED ORDER — ALUM & MAG HYDROXIDE-SIMETH 200-200-20 MG/5ML PO SUSP: 30.0000 mL | ORAL | Status: DC | PRN

## 2021-10-03 MED ORDER — BICTEGRAVIR-EMTRICITAB-TENOFOV 50-200-25 MG PO TABS
1.0000 | ORAL_TABLET | Freq: Every day | ORAL | Status: DC
  Administered 2021-10-04 – 2021-10-07 (×4): 1 via ORAL
  Filled 2021-10-03 (×6): qty 1

## 2021-10-03 MED ORDER — LOSARTAN POTASSIUM 50 MG PO TABS
50.0000 mg | ORAL_TABLET | Freq: Every day | ORAL | Status: DC
  Administered 2021-10-04 – 2021-10-07 (×4): 50 mg via ORAL
  Filled 2021-10-03 (×6): qty 1

## 2021-10-03 MED ORDER — NICOTINE 14 MG/24HR TD PT24
14.0000 mg | MEDICATED_PATCH | Freq: Every day | TRANSDERMAL | Status: DC
  Administered 2021-10-04 – 2021-10-07 (×4): 14 mg via TRANSDERMAL
  Filled 2021-10-03 (×6): qty 1

## 2021-10-03 MED ORDER — INFLUENZA VAC SPLIT QUAD 0.5 ML IM SUSY
0.5000 mL | PREFILLED_SYRINGE | INTRAMUSCULAR | Status: AC
  Administered 2021-10-04: 0.5 mL via INTRAMUSCULAR
  Filled 2021-10-03: qty 0.5

## 2021-10-03 MED ORDER — NICOTINE 14 MG/24HR TD PT24
14.0000 mg | MEDICATED_PATCH | Freq: Once | TRANSDERMAL | Status: DC
  Administered 2021-10-03: 14 mg via TRANSDERMAL
  Filled 2021-10-03: qty 1

## 2021-10-03 MED ORDER — ACETAMINOPHEN 325 MG PO TABS: 650.0000 mg | ORAL_TABLET | Freq: Four times a day (QID) | ORAL | Status: DC | PRN

## 2021-10-03 MED ORDER — LOSARTAN POTASSIUM 25 MG PO TABS
50.0000 mg | ORAL_TABLET | Freq: Every day | ORAL | Status: DC
  Administered 2021-10-03: 50 mg via ORAL
  Filled 2021-10-03: qty 2
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
## 2021-10-03 NOTE — ED Notes (Signed)
Patient being teleassed with sitter at bedside.
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
## 2021-10-03 NOTE — ED Notes (Signed)
Patient verbalizes understanding of safe transport and is agreeable to go to [facility]. Patient states all of his belongings were sent home with his dad. No evidence of patient belongings in ED storage room.

## 2021-10-03 NOTE — ED Notes (Signed)
Patient's dad at the bedside to sit with patient.

## 2021-10-03 NOTE — Group Note (Signed)
LCSW Group Therapy Note  10/03/2021      Type of Therapy and Topic:  Group Therapy: Gratitude   Description:   Group could not be held by social worker, but licensed RN did provide group.  A handout was given to each patient, with the following information:   Gratitude  "Acknowledging the good that you already have in your life is the foundation for all abundance." - Eckhart Tolle  " 'Enough' is a feast." - Buddhist Proverb  "Gratitude sweetens even the smallest moments."  "It is not joy that makes us grateful; It is gratitude that makes us joyful." - David Steindl-Rast    Put at least one response under each category of something for which you are grateful:  People:  Experiences:  Things:  Places:  Skills:  Other:  Add more responses as you get ideas from other people.   Therapeutic Modalities:   Activity  Goble Fudala J Grossman-Orr, LCSW .  

## 2021-10-03 NOTE — ED Notes (Signed)
Attempted to call report and nurse reported she was busy and would call this nurse back.

## 2021-10-03 NOTE — ED Notes (Signed)
Patient tearful and stating this process is overwhelming and he would like to leave. Patient educated on inpatient process and verbalizes understanding and is agreeable to stay. Patient states he is just anxious. Dr. Pearline Cables made aware; awaiting orders

## 2021-10-03 NOTE — Progress Notes (Signed)
   10/03/21 2200  Psych Admission Type (Psych Patients Only)  Admission Status Voluntary  Psychosocial Assessment  Patient Complaints Anxiety  Eye Contact Fair  Facial Expression Flat  Affect Appropriate to circumstance;Depressed  Speech Logical/coherent  Interaction Minimal  Motor Activity Slow  Appearance/Hygiene In scrubs  Behavior Characteristics Appropriate to situation  Mood Depressed  Thought Process  Coherency WDL  Content WDL  Delusions None reported or observed  Perception WDL  Hallucination None reported or observed  Judgment WDL  Confusion None  Danger to Self  Current suicidal ideation? Passive  Self-Injurious Behavior No self-injurious ideation or behavior indicators observed or expressed   Agreement Not to Harm Self Yes  Description of Agreement verebal
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
## 2021-10-03 NOTE — Progress Notes (Signed)
Adult Psychoeducational Group Note  Date:  10/03/2021 Time:  9:32 PM  Group Topic/Focus:  Wrap-Up Group:   The focus of this group is to help patients review their daily goal of treatment and discuss progress on daily workbooks.  Participation Level:  Active  Participation Quality:  Appropriate  Affect:  Appropriate  Cognitive:  Appropriate  Insight: Appropriate  Engagement in Group:  Engaged  Modes of Intervention:  Discussion  Additional Comments:  Braedan said his day was a 6.his goal for today seek help for needs and he achieved his goal . Coping skills being home.The one word describe his day was proactive.  Lenice Llamas Long 10/03/2021, 9:32 PM

## 2021-10-03 NOTE — ED Provider Notes (Signed)
Emergency Medicine Observation Re-evaluation Note  Ralph [name] is a 31 y.o. male, seen on rounds today.  Pt initially presented to the ED for complaints of Suicidal Currently, the patient is resting.  Physical Exam  BP (!) 146/103   Pulse 77   Temp 98.3 F (36.8 C) (Oral)   Resp 18   Ht 5\' 8"  (1.727 m)   Wt 80.7 kg   SpO2 99%   BMI 27.06 kg/m  Physical Exam General: nad Psych: calm  ED Course / MDM  EKG:   I have reviewed the labs performed to date as well as medications administered while in observation.  Recent changes in the last 24 hours include TTS eval was completed this am, recommended pt for inpatient. He is medically cleared.   Plan  Current plan is for placement.    Wynona Dove A, DO 10/03/21 1029

## 2021-10-04 DIAGNOSIS — F410 Panic disorder [episodic paroxysmal anxiety] without agoraphobia: Secondary | ICD-10-CM | POA: Diagnosis present

## 2021-10-04 DIAGNOSIS — I10 Essential (primary) hypertension: Secondary | ICD-10-CM | POA: Diagnosis present

## 2021-10-04 DIAGNOSIS — F4310 Post-traumatic stress disorder, unspecified: Secondary | ICD-10-CM | POA: Diagnosis present

## 2021-10-04 MED ORDER — LORAZEPAM 1 MG PO TABS: 1.0000 mg | ORAL_TABLET | Freq: Four times a day (QID) | ORAL | Status: AC | PRN

## 2021-10-04 MED ORDER — HYDROXYZINE HCL 25 MG PO TABS: 25.0000 mg | ORAL_TABLET | Freq: Four times a day (QID) | ORAL | Status: AC | PRN

## 2021-10-04 MED ORDER — VITAMIN B-1 100 MG PO TABS
100.0000 mg | ORAL_TABLET | Freq: Every day | ORAL | Status: DC
  Administered 2021-10-05 – 2021-10-07 (×3): 100 mg via ORAL
  Filled 2021-10-04 (×4): qty 1

## 2021-10-04 MED ORDER — FLUOXETINE HCL 10 MG PO CAPS
10.0000 mg | ORAL_CAPSULE | Freq: Every day | ORAL | Status: DC
  Administered 2021-10-05 – 2021-10-07 (×3): 10 mg via ORAL
  Filled 2021-10-04 (×4): qty 1

## 2021-10-04 MED ORDER — FLUOXETINE HCL 10 MG PO CAPS
10.0000 mg | ORAL_CAPSULE | Freq: Every day | ORAL | Status: DC
  Administered 2021-10-04: 10 mg via ORAL
  Filled 2021-10-04 (×2): qty 1

## 2021-10-04 MED ORDER — GABAPENTIN 100 MG PO CAPS
100.0000 mg | ORAL_CAPSULE | Freq: Three times a day (TID) | ORAL | Status: DC
  Administered 2021-10-04 – 2021-10-07 (×9): 100 mg via ORAL
  Filled 2021-10-04 (×14): qty 1

## 2021-10-04 MED ORDER — ARIPIPRAZOLE 10 MG PO TABS
10.0000 mg | ORAL_TABLET | Freq: Every day | ORAL | Status: DC
  Filled 2021-10-04 (×2): qty 1

## 2021-10-04 MED ORDER — LOPERAMIDE HCL 2 MG PO CAPS: 2.0000 mg | ORAL_CAPSULE | ORAL | Status: AC | PRN

## 2021-10-04 MED ORDER — ADULT MULTIVITAMIN W/MINERALS CH
1.0000 | ORAL_TABLET | Freq: Every day | ORAL | Status: DC
  Administered 2021-10-04 – 2021-10-07 (×4): 1 via ORAL
  Filled 2021-10-04 (×7): qty 1

## 2021-10-04 MED ORDER — ONDANSETRON 4 MG PO TBDP: 4.0000 mg | ORAL_TABLET | Freq: Four times a day (QID) | ORAL | Status: AC | PRN
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
## 2021-10-04 NOTE — Progress Notes (Signed)
Pt presents anxious with fair eye contact, logical, soft speech and is ambulatory in milieu with a steady gait. Rated his anxiety and depression both 4/10 with current stressor being "Just want to address all my issues and find a therapist". Denies SI, HI, AVH and pain when assessed. Compliant with medications as ordered, denies adverse drug reactions. Attended and participated in scheduled groups / activities on and off unit without issues. Verbal education provided on all medications and effects monitored. Emotional support, encouragement and reassurance provided to pt. Safety checks maintained at Q 15 minutes intervals without incident. Tolerated fluids and meals well.
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
## 2021-10-04 NOTE — B[facility] Group Notes (Signed)
Adult Psychoeducational Group Note Date:  10/04/2021 Time:  0900-1045 Group Topic/Focus: PROGRESSIVE RELAXATION. A group where deep breathing is taught and tensing and relaxation muscle groups is used. Imagery is used as well.  Pts are asked to imagine 3 pillars that hold them up when they are not able to hold themselves up and to share that with the group.  Participation Level:  Active  Participation Quality:  Appropriate  Affect:  Appropriate  Cognitive:  Oriented  Insight: Improving  Engagement in Group:  Engaged  Modes of Intervention:  Activity, Discussion, Education, and Support  Additional Comments:  Rates his energy as a 6/10. States God, friends and family and his dog  Paulino Rily

## 2021-10-04 NOTE — B[facility] Group Notes (Signed)
Adult Psychoeducational Group  Date:  10/04/2021 Time:  1300-1400  Group Topic/Focus: Continuation of the group from Saturday. Looking at the lists that were created and talking about what needs to be done with the homework of 30 positives about themselves.                                     Talking about taking their power back and helping themselves to develop a positive self esteem.      Participation Quality:  Appropriate  Affect:  Appropriate  Cognitive:  Oriented  Insight: Improving  Engagement in Group:  Engaged  Modes of Intervention:  Activity, Discussion, Education, and Support  Additional Comments:  rates his energy at a 6/10.  Ralph [name]

## 2021-10-04 NOTE — Group Note (Signed)
LCSW Group Therapy Note  10/04/2021      Type of Therapy and Topic:  Group Therapy: Positive Affirmations   Description:   Group could not be held by social worker, but licensed RN did provide group.    CSW provided a handout for each patient, with the following information:   Positive Affirmation Worksheet  Programming your subconscious by repeating positive statements with focus, intention  and belief is a technique called positive affirmations. This Worksheet will walk you  through the process of creating your own positive affirmations.   Releasing Negative Feelings  It is believed that this process is more effective when incorporating and understanding the negative  feelings, or mental programs that you harbor within your subconscious regarding yourself. This first part  will help you identify your own negative beliefs. When you shine your conscious light on your negative  beliefs and understand that they are merely beliefs and not based on reality, you can then utilize your  positive affirmations to overcome such beliefs and focus the rudder of your own life. Write as many negative beliefs down as you feel apply to your feelings about yourself.  Use the following prompts as a guide: I never. Nobody else. I'm the only one that. I am not. I don't. I don't want. I hate. I can't. Identifying Wants  Now, considering each of the negative feelings that you wrote down about yourself, write  a list of what you really want or deserve being very specific.   Creating Affirmations  From the previous exercise, distill your wants to a list of your desires. Write down a list of  your positive affirmations. Use the following prompts as a guide: I am. I welcome. I deserve. I choose. I believe. I trust. I have. I know. I feel. I create. I LOVE. One of the most powerful affirmations starts with I am.  I am.  Vocalize Affirmations Daily  Now that you have your list of  affirmations repeat them out loud to yourself several times each  day. When you say them, focus on their meanings and visualize your life as though your  affirmations have already become reality.    Therapeutic Modalities:   Activity  Emy Angevine J Grossman-Orr, LCSW .  

## 2021-10-04 NOTE — Progress Notes (Signed)
Adult Psychoeducational Group Note  Date:  10/04/2021 Time:  9:42 PM  Group Topic/Focus:  Wrap-Up Group:   The focus of this grup is to help patients review their daily goal of treatment and discuss progress on daily workbooks.  Participation Level:  Active  Participation Quality:  Appropriate  Affect:  Appropriate  Cognitive:  Appropriate  Insight: Appropriate  Engagement in Group:  Engaged  Modes of Intervention:  Discussion  Additional Comments:   Pt states tat he had a good day and was able to be more vulnerable during group discussions. Pt states that he wants to focus on loving himself more than he does and states that the group that he had earlier in the day help him feel better.  Gerhard Perches 10/04/2021, 9:42 PM

## 2021-10-04 NOTE — Progress Notes (Signed)
   10/04/21 2229  Psych Admission Type (Psych Patients Only)  Admission Status Voluntary  Psychosocial Assessment  Patient Complaints Anxiety;Depression  Eye Contact Fair  Facial Expression Flat  Affect Appropriate to circumstance;Depressed  Speech Logical/coherent  Interaction Minimal  Motor Activity Slow  Appearance/Hygiene In scrubs  Behavior Characteristics Cooperative  Mood Depressed;Anxious;Pleasant  Thought Process  Coherency WDL  Content WDL  Delusions None reported or observed  Perception WDL  Hallucination None reported or observed  Judgment WDL  Confusion None  Danger to Self  Current suicidal ideation? Passive  Self-Injurious Behavior No self-injurious ideation or behavior indicators observed or expressed   Agreement Not to Harm Self Yes  Description of Agreement verebal

## 2021-10-04 NOTE — H&P (Addendum)
Psychiatric Admission Assessment Adult  Patient Identification: Ralph [name]  MRN:  [id number]  Date of Evaluation:  10/04/2021  Chief Complaint:  MDD (major depressive disorder), recurrent episode, severe ([place]) [F33.2]  Principal Diagnosis: MDD (major depressive disorder), recurrent episode, severe ([place])  Diagnosis:  Principal Problem:   MDD (major depressive disorder), recurrent episode, severe ([place])  History of Present Illness: Ralph [name] is a 31 year old Caucasian single male who presents voluntarily to [facility] from [facility] emergency room hospital for complaint of worsening suicidal thoughts, anxiety and depression.  Patient has past psychiatric history and past medical history of major depressive disorder recurrent severe, anxiety, encounter for long-term use of medication, human immunodeficiency virus disease, medication monitoring encounter, screening exam for veneral disease, and tobacco abuse.  Patient lives alone with his dog, and works in [employer].  Patient reports that on October 01, 2021, he visited his friends grave to to spend some time there and left flowers on her grave.  Affect that, he went out with other friends for bowling/karaoke. After bowling, they started drinking heavily.  Then patient left his friends intoxicated, and went to the parking lot, where he found himself crying, screaming, and calling people names. The standby people saw him with this erratic behavior and called the police.  The police showed up and picked patient up from the parking lot to [facility] detention center where he was held for public intoxication and was released the following morning.  Patient was charged for this behavior and has to appear in court on November 06, 2021.  Patient reports that his stressors for his intoxication on October 01, 2021 was remembering and celebrating his friend's birthday with other friends. Secondly he  remembered his paternal grandmother died of suicide that made him so sad.  Prior to this incident patient reports that his depressive symptoms which includes and he Dona, insomnia, fatigue, feeling of worthlessness/guilt, hopelessness, impaired memory, recurrent thoughts of death, suicidal thoughts without plan, anxiety, panic, loss of energy/fatigue, and disturbed sleep started in 2018 and has been happening on and off, and he will take some alcohol without any problems.  Then recently this year, above symptoms has been happening frequently, which resulted in his drinking more alcohol.  He started with drinking 1 beer, 2 beers, 2-3 times per week.  Then 2 months ago he added vodka and whiskey, drinking this frequently 2-3 times weekly.  From chart review patient has been having intrusive daydreaming of suicidal thoughts, thinking it is better for him to die than live.  His father went to his house and found 10 bottles of liquor that patient had consumed and was worried about patient.  Patient denies access to firearms, denies being hospitalized in a mental health hospital, denies taking psychotropic medications and denies outpatient therapy services.  Reports being paranoia thinking someone is having some bad thoughts about him whenever he walks his dog.  Assessment: On assessment today, patient is seen and examined on [place].  He appears calm and participating in the examination.  Chart reviewed and findings shared with the treatment team and discussed with Dr. Nelda Marseille.  Alert and oriented to person, time, place, and situation.  Speech fluent, clear, with normal volume and pattern.  Able to maintain good eye contact with the provider throughout the encounter.  Presents with depressed mood and affect appropriate and congruent.  Thought process coherent and goal directed.  With thought content logical.  Patient stated continuously, "I just  need help from alcohol so that I can get along with my life and not  go down the bad path."  States he has never been hospitalized or get into a major problem like being caught by the police. " I really need help from my alcohol drinking."  Sensorium with memory, judgment, and insight fair. Patient is HIV positive and currently taking Biktarvy for his disease state.  Abnormal labs of 10/02/2021 reviewed, CMP: Glucose 126 high, total protein 8.3 high, otherwise normal.  Lipid profile: Ordered.  Other drugs: Acetaminophen less than 10, salicylate less than 7.  BAL less than 10.  UDS: none dictated.  Based on associated symptoms listed below, patient is admitted for mood stabilization, medication management, and safety.  Associated Signs/Symptoms:  Depression Symptoms:  depressed mood, anhedonia, insomnia, fatigue, feelings of worthlessness/guilt, difficulty concentrating, hopelessness, impaired memory, recurrent thoughts of death, suicidal thoughts without plan, anxiety, panic attacks, loss of energy/fatigue, disturbed sleep,  Duration of Depression Symptoms: Less than two weeks  (Hypo) Manic Symptoms:  Distractibility, Elevated Mood, Flight of Ideas, Impulsivity, Irritable Mood, Labiality of Mood,  Anxiety Symptoms:  Excessive Worry, Panic Symptoms, Social Anxiety,  Psychotic Symptoms:  Paranoia,  PTSD Symptoms: NA Total Time spent with patient: 1 hour  Past Psychiatric History: Patient states he started having symptoms of depression in 2018, and the symptoms comes and goes then last month in August 2023 symptoms started being severe.  Patient has history of ADHD and being treated with Adderall.  Patient has never been hospitalized in a psychiatric hospital or received any therapy.  Is the patient at risk to self? Yes.    Has the patient been a risk to self in the past 6 months? Yes.    Has the patient been a risk to self within the distant past? No.  Is the patient a risk to others? No.  Has the patient been a risk to others in the past 6  months? No.  Has the patient been a risk to others within the distant past? No.   [place] Scale:  [place] Admission (Current) from 10/03/2021 in [facility] ED from 10/02/2021 in [facility] ED from 08/11/2021 in [facility] Moderate Risk Moderate Risk No Risk        Prior Inpatient Therapy:  No Prior Outpatient Therapy:  No     Alcohol Screening: 1. How often do you have a drink containing alcohol?: 4 or more times a week 2. How many drinks containing alcohol do you have on a typical day when you are drinking?: 5 or 6 3. How often do you have six or more drinks on one occasion?: Weekly AUDIT-C Score: 9 4. How often during the last year have you found that you were not able to stop drinking once you had started?: Less than monthly 5. How often during the last year have you failed to do what was normally expected from you because of drinking?: Never 6. How often during the last year have you needed a first drink in the morning to get yourself going after a heavy drinking session?: Weekly 7. How often during the last year have you had a feeling of guilt of remorse after drinking?: Weekly 8. How often during the last year have you been unable to remember what happened the night before because you had been drinking?: Never 9. Have you or someone else been injured as a result of  your drinking?: No 10. Has a relative or friend or a doctor or another health worker been concerned about your drinking or suggested you cut down?: Yes, during the last year Alcohol Use Disorder Identification Test Final Score (AUDIT): 20 Alcohol Brief Interventions/Follow-up: Alcohol education/Brief advice Substance Abuse History in the last 12 months:  Yes.   Consequences of Substance Abuse: Legal Consequences:  Patient had public intoxication and has to appear in court at [facility] center on November 06, 2021  Previous Psychotropic Medications: Yes   Psychological Evaluations: Yes   Past Medical History:  Past Medical History:  Diagnosis Date   Anxiety    Depression    HIV (human immunodeficiency virus infection) ([place])    Hypertension     Past Surgical History:  Procedure Laterality Date   INNER EAR SURGERY     Family History: History reviewed. No pertinent family history.  Family Psychiatric  History: Patient states paternal grandmother died of suicide.  Tobacco Screening:    Social History:  Social History   Substance and Sexual Activity  Alcohol Use Yes   Comment: occassional     Social History   Substance and Sexual Activity  Drug Use No    Additional Social History:  Allergies:  No Known Allergies Lab Results:  Results for orders placed or performed during the hospital encounter of 10/02/21 (from the past 48 hour(s))  Rapid urine drug screen (hospital performed)     Status: None   Collection Time: 10/02/21  6:13 PM  Result Value Ref Range   Opiates NONE DETECTED NONE DETECTED   Cocaine NONE DETECTED NONE DETECTED   Benzodiazepines NONE DETECTED NONE DETECTED   Amphetamines NONE DETECTED NONE DETECTED   Tetrahydrocannabinol NONE DETECTED NONE DETECTED   Barbiturates NONE DETECTED NONE DETECTED    Comment: (NOTE) DRUG SCREEN FOR MEDICAL PURPOSES ONLY.  IF CONFIRMATION IS NEEDED FOR ANY PURPOSE, NOTIFY LAB WITHIN 5 DAYS.  LOWEST DETECTABLE LIMITS FOR URINE DRUG SCREEN Drug Class                     Cutoff (ng/mL) Amphetamine and metabolites    1000 Barbiturate and metabolites    200 Benzodiazepine                 161 Tricyclics and metabolites     300 Opiates and metabolites        300 Cocaine and metabolites        300 THC                            50 Performed at [facility] [place], [place] [address]   Comprehensive metabolic panel     Status: Abnormal   Collection Time: 10/02/21  6:38 PM  Result Value Ref Range    Sodium 139 135 - 145 mmol/L   Potassium 3.9 3.5 - 5.1 mmol/L   Chloride 104 98 - 111 mmol/L   CO2 26 22 - 32 mmol/L   Glucose, Bld 126 (H) 70 - 99 mg/dL    Comment: Glucose reference range applies only to samples taken after fasting for at least 8 hours.   BUN 10 6 - 20 mg/dL   Creatinine, Ser 0.88 0.61 - 1.24 mg/dL   Calcium 9.3 8.9 - 10.3 mg/dL   Total Protein 8.3 (H) 6.5 - 8.1 g/dL   Albumin 4.5 3.5 - 5.0 g/dL   AST 29 15 -  41 U/L   ALT 25 0 - 44 U/L   Alkaline Phosphatase 93 38 - 126 U/L   Total Bilirubin 1.1 0.3 - 1.2 mg/dL   GFR, Estimated >60 >60 mL/min    Comment: (NOTE) Calculated using the CKD-EPI Creatinine Equation (2021)    Anion gap 9 5 - 15    Comment: Performed at [facility], [address] [place], [place] [address]  Ethanol     Status: None   Collection Time: 10/02/21  6:38 PM  Result Value Ref Range   Alcohol, Ethyl (B) <10 <10 mg/dL    Comment: (NOTE) Lowest detectable limit for serum alcohol is 10 mg/dL.  For medical purposes only. Performed at [facility], [address], [place], [place] [id number]   Salicylate level     Status: Abnormal   Collection Time: 10/02/21  6:38 PM  Result Value Ref Range   Salicylate Lvl Q000111Q (L) 7.0 - 30.0 mg/dL    Comment: Performed at [facility], [address], [place], [place] [address]  Acetaminophen level     Status: Abnormal   Collection Time: 10/02/21  6:38 PM  Result Value Ref Range   Acetaminophen (Tylenol), Serum <10 (L) 10 - 30 ug/mL    Comment: (NOTE) Therapeutic concentrations vary significantly. A range of 10-30 ug/mL  may be an effective concentration for many patients. However, some  are best treated at concentrations outside of this range. Acetaminophen concentrations >150 ug/mL at 4 hours after ingestion  and >50 ug/mL at 12 hours after ingestion are often associated with  toxic reactions.  Performed at [facility], [address] [place], [place] [address]   cbc     Status: None   Collection  Time: 10/02/21  6:38 PM  Result Value Ref Range   WBC 10.2 4.0 - 10.5 K/uL   RBC 4.82 4.22 - 5.81 MIL/uL   Hemoglobin 15.7 13.0 - 17.0 g/dL   HCT 44.5 39.0 - 52.0 %   MCV 92.3 80.0 - 100.0 fL   MCH 32.6 26.0 - 34.0 pg   MCHC 35.3 30.0 - 36.0 g/dL   RDW 12.4 11.5 - 15.5 %   Platelets 261 150 - 400 K/uL   nRBC 0.0 0.0 - 0.2 %    Comment: Performed at [facility], [address] [place], [place] [address]  Resp Panel by RT-PCR (Flu A&B, Covid) Anterior Nasal Swab     Status: None   Collection Time: 10/02/21  8:03 PM   Specimen: Anterior Nasal Swab  Result Value Ref Range   SARS Coronavirus 2 by RT PCR NEGATIVE NEGATIVE    Comment: (NOTE) SARS-CoV-2 target nucleic acids are NOT DETECTED.  The SARS-CoV-2 RNA is generally detectable in upper respiratory specimens during the acute phase of infection. The lowest concentration of SARS-CoV-2 viral copies this assay can detect is 138 copies/mL. A negative result does not preclude SARS-Cov-2 infection and should not be used as the sole basis for treatment or other patient management decisions. A negative result may occur with  improper specimen collection/handling, submission of specimen other than nasopharyngeal swab, presence of viral mutation(s) within the areas targeted by this assay, and inadequate number of viral copies(<138 copies/mL). A negative result must be combined with clinical observations, patient history, and epidemiological information. The expected result is Negative.  Fact Sheet for Patients:  EntrepreneurPulse.com.au  Fact Sheet for Healthcare Providers:  IncredibleEmployment.be  This test is no t yet approved or cleared by the [place] and  has been authorized for detection and/or diagnosis of SARS-CoV-2 by FDA under an Emergency Use Authorization (EUA). This EUA will remain  in effect (meaning this test can be used) for the duration of the COVID-19 declaration under  Section 564(b)(1) of the Act, 21 U.S.C.section 360bbb-3(b)(1), unless the authorization is terminated  or revoked sooner.       Influenza A by PCR NEGATIVE NEGATIVE   Influenza B by PCR NEGATIVE NEGATIVE    Comment: (NOTE) The Xpert Xpress SARS-CoV-2/FLU/RSV plus assay is intended as an aid in the diagnosis of influenza from Nasopharyngeal swab specimens and should not be used as a sole basis for treatment. Nasal washings and aspirates are unacceptable for Xpert Xpress SARS-CoV-2/FLU/RSV testing.  Fact Sheet for Patients: EntrepreneurPulse.com.au  Fact Sheet for Healthcare Providers: IncredibleEmployment.be  This test is not yet approved or cleared by the [place] FDA and has been authorized for detection and/or diagnosis of SARS-CoV-2 by FDA under an Emergency Use Authorization (EUA). This EUA will remain in effect (meaning this test can be used) for the duration of the COVID-19 declaration under Section 564(b)(1) of the Act, 21 U.S.C. section 360bbb-3(b)(1), unless the authorization is terminated or revoked.  Performed at [facility], [address], [place], [place] [address]     Blood Alcohol level:  Lab Results  Component Value Date   [facility] <10 Q000111Q    Metabolic Disorder Labs:  No results found for: "HGBA1C", "MPG" No results found for: "PROLACTIN" Lab Results  Component Value Date   CHOL 221 (H) 12/19/2020   TRIG 99 12/19/2020   HDL 48 12/19/2020   CHOLHDL 4.6 12/19/2020   LDLCALC 152 (H) 12/19/2020   LDLCALC 139 (H) 12/17/2019    Current Medications: Current Facility-Administered Medications  Medication Dose Route Frequency Provider Last Rate Last Admin   acetaminophen (TYLENOL) tablet 650 mg  650 mg Oral Q6H PRN Leevy-Johnson, Brooke A, NP       alum & mag hydroxide-simeth (MAALOX/MYLANTA) 200-200-20 MG/5ML suspension 30 mL  30 mL Oral Q4H PRN Leevy-Johnson, Brooke A, NP        bictegravir-emtricitabine-tenofovir AF (BIKTARVY) 50-200-25 MG per tablet 1 tablet  1 tablet Oral Daily Leevy-Johnson, Brooke A, NP   1 tablet at 10/04/21 0805   hydrOXYzine (ATARAX) tablet 25 mg  25 mg Oral Q6H PRN Nelda Marseille, Amy E, MD       influenza vac split quadrivalent PF (FLUARIX) injection 0.5 mL  0.5 mL Intramuscular Tomorrow-1000 Nelda Marseille, Amy E, MD       loperamide (IMODIUM) capsule 2-4 mg  2-4 mg Oral PRN Nelda Marseille, Amy E, MD       LORazepam (ATIVAN) tablet 1 mg  1 mg Oral Q6H PRN Harlow Asa, MD       losartan (COZAAR) tablet 50 mg  50 mg Oral Daily Leevy-Johnson, Brooke A, NP   50 mg at 10/04/21 0805   magnesium hydroxide (MILK OF MAGNESIA) suspension 30 mL  30 mL Oral Daily PRN Leevy-Johnson, Brooke A, NP       multivitamin with minerals tablet 1 tablet  1 tablet Oral Daily Singleton, Amy E, MD       nicotine (NICODERM CQ - dosed in mg/24 hours) patch 14 mg  14 mg Transdermal Daily Leevy-Johnson, Brooke A, NP   14 mg at 10/04/21 0805   ondansetron (ZOFRAN-ODT) disintegrating tablet 4 mg  4 mg Oral Q6H PRN Harlow Asa, MD       [START ON 10/05/2021] thiamine (Vitamin B-1) tablet 100  mg  100 mg Oral Daily Harlow Asa, MD       PTA Medications: Medications Prior to Admission  Medication Sig Dispense Refill Last Dose   amphetamine-dextroamphetamine (ADDERALL) 20 MG tablet Take 20 mg by mouth 2 (two) times daily.   10/03/2021   bictegravir-emtricitabine-tenofovir AF (BIKTARVY) 50-200-25 MG TABS tablet Take 1 tablet by mouth daily. 30 tablet 11 10/03/2021   ibuprofen (ADVIL) 200 MG tablet Take 600 mg by mouth every 6 (six) hours as needed for moderate pain (as needed for foot pain).   Past Week   losartan (COZAAR) 50 MG tablet Take 50 mg by mouth daily.   10/03/2021   Multiple Vitamin (MULTIVITAMIN) tablet Take 1 tablet by mouth daily.   Past Month    Musculoskeletal: Strength & Muscle Tone: within normal limits Gait & Station: normal Patient leans: N/A  Psychiatric  Specialty Exam:  Presentation  General Appearance: Appropriate for Environment; Casual; Fairly Groomed  Eye Contact:Good  Speech:Clear and Coherent  Speech Volume:Normal  Handedness:Right  Mood and Affect  Mood:Depressed  Affect:Appropriate; Congruent  Thought Process  Thought Processes:Coherent; Goal Directed  Duration of Psychotic Symptoms: No data recorded Past Diagnosis of Schizophrenia or Psychoactive disorder: No  Descriptions of Associations:Intact  Orientation:Full (Time, Place and Person)  Thought Content:Logical  Hallucinations:Hallucinations: None  Ideas of Reference:None  Suicidal Thoughts:Suicidal Thoughts: No SI Passive Intent and/or Plan: -- (Not applicable)  Homicidal Thoughts:Homicidal Thoughts: No  Sensorium  Memory:Immediate Fair; Recent Fair; Remote Fair  Judgment:Fair  Insight:Fair  Executive Functions  Concentration:Good  Attention Span:Good  [place]  Language:Good  Psychomotor Activity  Psychomotor Activity:Psychomotor Activity: Normal  Assets  Assets:Communication Skills; Financial Resources/Insurance; Desire for Improvement; Housing; Physical Health; Vocational/Educational  Sleep  Sleep:Sleep: Good Number of Hours of Sleep: 8  Physical Exam: Physical Exam Vitals and nursing note reviewed.  Constitutional:      Appearance: Normal appearance.  HENT:     Head: Normocephalic and atraumatic.     Right Ear: External ear normal.     Left Ear: External ear normal.     Nose: Nose normal.     Mouth/Throat:     Mouth: Mucous membranes are moist.     Pharynx: Oropharynx is clear.  Eyes:     Extraocular Movements: Extraocular movements intact.     Conjunctiva/sclera: Conjunctivae normal.     Pupils: Pupils are equal, round, and reactive to light.  Cardiovascular:     Rate and Rhythm: Normal rate.     Pulses: Normal pulses.  Pulmonary:     Effort: Pulmonary effort is normal.  Abdominal:      Palpations: Abdomen is soft.  Genitourinary:    Comments: Deferred Musculoskeletal:     Cervical back: Normal range of motion and neck supple.  Skin:    General: Skin is warm.  Neurological:     General: No focal deficit present.     Mental Status: He is alert and oriented to person, place, and time.  Psychiatric:        Mood and Affect: Mood normal.        Behavior: Behavior normal.    Review of Systems  Constitutional: Negative.  Negative for chills and fever.  HENT: Negative.  Negative for hearing loss and tinnitus.   Eyes: Negative.   Respiratory: Negative.  Negative for cough, sputum production, shortness of breath and wheezing.   Cardiovascular: Negative.  Negative for chest pain and palpitations.  Gastrointestinal: Negative.  Negative for abdominal pain,  constipation, diarrhea, heartburn, nausea and vomiting.  Genitourinary: Negative.  Negative for dysuria, frequency and urgency.  Musculoskeletal: Negative.  Negative for myalgias and neck pain.  Skin: Negative.  Negative for itching and rash.  Neurological: Negative.  Negative for dizziness, tingling, tremors and headaches.  Endo/Heme/Allergies: Negative.  Negative for environmental allergies and polydipsia. Does not bruise/bleed easily.  Psychiatric/Behavioral:  Positive for depression and suicidal ideas. The patient is nervous/anxious and has insomnia.    Blood pressure 118/87, pulse 83, temperature 98.5 F (36.9 C), temperature source Oral, resp. rate 16, height 5\' 8"  (1.727 m), weight 80.7 kg, SpO2 98 %. Body mass index is 27.06 kg/m.  Treatment Plan Summary: Daily contact with patient to assess and evaluate symptoms and progress in treatment and Medication management  Observation Level/Precautions:  15 minute checks  Laboratory:  CBC Chemistry Profile HbAIC UDS UA  Psychotherapy: Therapeutic milieu  Medications: See MAR  Consultations: Pending  Discharge Concerns: Safety  Estimated LOS: 5 to 7 days  Other:      Physician Treatment Plan for Primary Diagnosis: MDD (major depressive disorder), recurrent episode, severe ([place]) Long Term Goal(s): Improvement in symptoms so as ready for discharge  Short Term Goals: Ability to identify changes in lifestyle to reduce recurrence of condition will improve, Ability to verbalize feelings will improve, Ability to disclose and discuss suicidal ideas, Ability to demonstrate self-control will improve, Ability to identify and develop effective coping behaviors will improve, Ability to maintain clinical measurements within normal limits will improve, Compliance with prescribed medications will improve, and Ability to identify triggers associated with substance abuse/mental health issues will improve  Physician Treatment Plan for Secondary Diagnosis: Principal Problem:   MDD (major depressive disorder), recurrent episode, severe ([place]) Treatment Plan Summary: Daily contact with patient to assess and evaluate symptoms and progress in treatment and Medication management  Plan: Safety and Monitoring: Voluntary admission to inpatient psychiatric unit for safety, stabilization and treatment Daily contact with patient to assess and evaluate symptoms and progress in treatment Patient's case to be discussed in multi-disciplinary team meeting Observation Level : q15 minute checks Vital signs: q12 hours Precautions: suicide, but pt currently verbally contracts for safety on unit   MDD (major depressive disorder, recurrent severe.) --Initiate Prozac 10 mg p.o. daily (Indication and rationale's, benefits, and possible side effects of this medication has been discussed with patient)  Anxiety -Initiate hydroxyzine 25 mg 3 times daily as needed/anxiety   Insomnia -Initiate trazodone 50 mg   Other PRN Medications - Acetaminophen 650 mg every 6 as needed/mild pain -Maalox 30 mL oral every 4 as needed/digestion -Magnesium hydroxide 30 mL daily as needed/mild constipation    Discharge Planning: Social work and case management to assist with discharge planning and identification of hospital follow-up needs prior to discharge Estimated LOS: 5-7 days Discharge Concerns: Need to establish a safety plan; Medication compliance and effectiveness Discharge Goals: Return home with outpatient referrals for mental health follow-up including medication management/psychotherapy.   I certify that inpatient services furnished can reasonably be expected to improve the patient's condition.    Laretta Bolster, FNP 9/17/202311:12 AM

## 2021-10-04 NOTE — Group Note (Deleted)
LCSW Group Therapy Note  Group Date: 10/04/2021 Start Time: 1000 End Time: 1001   Type of Therapy and Topic:  Group Therapy: Positive Affirmations  Participation Level:  {[facility] PARTICIPATION LEVEL:22264}   Description of Group:   This group addressed positive affirmation towards self and others.  Patients went around the room and identified two positive things about themselves and two positive things about a peer in the room.  Patients reflected on how it felt to share something positive with others, to identify positive things about themselves, and to hear positive things from others/ Patients were encouraged to have a daily reflection of positive characteristics or circumstances.   Therapeutic Goals: 1. Patients will verbalize two of their positive qualities 2. Patients will demonstrate empathy for others by stating two positive qualities about a peer in the group 3. Patients will verbalize their feelings when voicing positive self affirmations and when voicing positive affirmations of others 4. Patients will discuss the potential positive impact on their wellness/recovery of focusing on positive traits of self and others.  Summary of Patient Progress:  *** actively engaged in the discussion and . S*** was able***or not able to identify positive affirmations about ***self as well as other group members. Patient demonstrated *** insight into the subject matter, was respectful of peers, participated throughout the entire session.  Therapeutic Modalities:   Cognitive Behavioral Therapy Motivational Interviewing    Ryhanna Dunsmore J Grossman-Orr, LCSWA 10/04/2021  8:49 AM    

## 2021-10-05 ENCOUNTER — Encounter (HOSPITAL_COMMUNITY): Payer: Self-pay

## 2021-10-05 DIAGNOSIS — F332 Major depressive disorder, recurrent severe without psychotic features: Secondary | ICD-10-CM | POA: Diagnosis not present

## 2021-10-05 LAB — LIPID PANEL
Cholesterol: 220 mg/dL — ABNORMAL HIGH (ref 0–200)
HDL: 53 mg/dL (ref >40)
LDL Cholesterol: 142 mg/dL — ABNORMAL HIGH (ref 0–99)
Total CHOL/HDL Ratio: 4.2 RATIO
Triglycerides: 123 mg/dL (ref <150)
VLDL: 25 mg/dL (ref 0–40)

## 2021-10-05 LAB — TSH: TSH: 4.047 u[IU]/mL (ref 0.350–4.500)

## 2021-10-05 LAB — HEMOGLOBIN A1C
Hgb A1c MFr Bld: 4.9 % (ref 4.8–5.6)
Mean Plasma Glucose: 93.93 mg/dL

## 2021-10-05 NOTE — Group Note (Signed)
LCSW Group Therapy Note   Group Date: 10/05/2021 Start Time: 1300 End Time: 1400  Type of Therapy and Topic: Group Therapy: Overcoming Obstacles  Participation Level: Did Not Attend  Description of Group: In this group patients will be encouraged to explore what they see as obstacles to their own wellness and recovery. They will be guided to discuss their thoughts, feelings, and behaviors related to these obstacles. The group will process together ways to cope with barriers, with attention given to specific choices patients can make. Each patient will be challenged to identify changes they are motivated to make in order to overcome their obstacles. This group will be process-oriented, with patients participating in exploration of their own experiences as well as giving and receiving support and challenge from other group members.  Therapeutic Goals: 1. Patient will identify personal and current obstacles as they relate to admission. 2. Patient will identify barriers that currently interfere with their wellness or overcoming obstacles. 3. Patient will identify feelings, thought process and behaviors related to these barriers. 4. Patient will identify two changes they are willing to make to overcome these obstacles:  Summary of Patient Progress:  Did not attend   Darleen Crocker, [place] 10/05/2021  2:32 PM

## 2021-10-05 NOTE — Progress Notes (Addendum)
[facility] MD Progress Note  10/05/2021 8:17 AM Ralph [name]  MRN:  [id number]  Chief Complaint: SI  Reason for Admission:  Ralph [name] is a 31 y.o. male with a history of ADHD, who was admitted voluntarily for management of panic attacks, worsening depression, passive SI, and alcohol abuse.The patient is currently on Hospital Day 2.   Chart Review from last 24 hours:  The patient's chart was reviewed and nursing notes were reviewed. The patient's case was discussed in multidisciplinary team meeting. Per nursing, patient had passive SI but contracted for safety. He had no acute behavioral issues and attended groups. Per [facility] he was compliant with scheduled medications and did receive MOM X1 PRN.  Information Obtained Today During Patient Interview: The patient was seen and evaluated on the unit. On assessment today the patient reports that he can tell "a huge difference" in his anxiety since starting Neurontin. He states "I feel lighter. I can breathe." He reports that he has been socializing with peers and this is unusual for him given his recent anxiety issues. He has been attending groups and states he overall feels more hopeful. He reports that the Neurontin has made him a "little drowsy" and we discussed that he should acclimate to the medication. He reports good appetite and denies issues with paranoia on the unit. He states that what he described as paranoia prior to admission is related to him feeling self-conscious around people he does not know and due to worry he is being judged as a gay male after being bullied as a child for being gay. We discussed that this is more so likely as result of his past trauma and PTSD rather than evidence of psychosis. He denies AVH, ideas of reference, first rank symptoms, or delusions. He denies any flashbacks or nightmares on the unit. He denies SI or HI. He voices no other physical complaints. We discussed option for PHP after discharge and he will  consider. He denies any cravings or signs of withdrawal from alcohol use. He again was questioned about possible manic sx in the past and reports he has not had protracted episodes of excessive energy, decreased need for sleep, excessive spending/impulsivity, hyper-sexual behaviors, risk taking, or grandiosity. Discussed plans to have him complete MDQ and need to monitor for mood escalation with start of an antidepressant.  Principal Problem: MDD (major depressive disorder), recurrent episode, severe (HCC) Diagnosis: Principal Problem:   MDD (major depressive disorder), recurrent episode, severe (HCC) Active Problems:   Human immunodeficiency virus (HIV) disease (HCC)   Tobacco abuse   PTSD (post-traumatic stress disorder)   Panic attacks   Hypertension  Total Time Spent in Direct Patient Care:  I personally spent 30 minutes on the unit in direct patient care. The direct patient care time included face-to-face time with the patient, reviewing the patient's chart, communicating with other professionals, and coordinating care. Greater than 50% of this time was spent in counseling or coordinating care with the patient regarding goals of hospitalization, psycho-education, and discharge planning needs.  Past Psychiatric History: H/o ADHD on Adderall 20mg ; Previously on Wellbutrin for smoking cessation; denies other past psychotropic med trials. He does not have a current therapist but did see one through the ID clinic in 2018. He has never seen a psychiatrist. This is his first inpatient admission.  He denies h/o SIB, previous suicide attempts or previous inpatient admissions.  Past Medical History:  Past Medical History:  Diagnosis Date   Anxiety    Depression  HIV (human immunodeficiency virus infection) (HCC)    Hypertension     Past Surgical History:  Procedure Laterality Date   INNER EAR SURGERY      Family Psychiatric  History:  He states his paternal grandmother committed suicide  and he is not sure if she had bipolar d/o or schizophrenia. He thinks depression runs on his maternal side of the family, addiction with alcohol runs on his paternal side in great aunts and uncles, and he is unsure about specific mental health issues on paternal side of the family.   Social History:  Social History   Substance and Sexual Activity  Alcohol Use Yes   Comment: occassional     Social History   Substance and Sexual Activity  Drug Use No    Social History   Socioeconomic History   Marital status: Single    Spouse name: Not on file   Number of children: Not on file   Years of education: Not on file   Highest education level: Not on file  Occupational History   Not on file  Tobacco Use   Smoking status: Former    Types: E-cigarettes   Smokeless tobacco: Never   Tobacco comments:    States he quit 3 months ago (06/19/21)  Substance and Sexual Activity   Alcohol use: Yes    Comment: occassional   Drug use: No   Sexual activity: Yes    Partners: Male    Birth control/protection: None    Comment: condoms declined  Other Topics Concern   Not on file  Social History Narrative   Not on file   Social Determinants of Health   Financial Resource Strain: Not on file  Food Insecurity: Not on file  Transportation Needs: Not on file  Physical Activity: Not on file  Stress: Not on file  Social Connections: Not on file   Sleep: Good per patient - total time unrecorded  Appetite:  Good  Current Medications: Current Facility-Administered Medications  Medication Dose Route Frequency Provider Last Rate Last Admin   acetaminophen (TYLENOL) tablet 650 mg  650 mg Oral Q6H PRN Leevy-Johnson, Brooke A, NP       alum & mag hydroxide-simeth (MAALOX/MYLANTA) 200-200-20 MG/5ML suspension 30 mL  30 mL Oral Q4H PRN Leevy-Johnson, Brooke A, NP       bictegravir-emtricitabine-tenofovir AF (BIKTARVY) 50-200-25 MG per tablet 1 tablet  1 tablet Oral Daily Leevy-Johnson, Brooke A, NP    1 tablet at 10/04/21 0805   FLUoxetine (PROZAC) capsule 10 mg  10 mg Oral Daily Mason Jim, Lasharn Bufkin E, MD       gabapentin (NEURONTIN) capsule 100 mg  100 mg Oral TID Mason Jim, Arlen Legendre E, MD   100 mg at 10/04/21 1714   hydrOXYzine (ATARAX) tablet 25 mg  25 mg Oral Q6H PRN Mason Jim, Angelina Neece E, MD       loperamide (IMODIUM) capsule 2-4 mg  2-4 mg Oral PRN Mason Jim, Zyere Jiminez E, MD       LORazepam (ATIVAN) tablet 1 mg  1 mg Oral Q6H PRN Mason Jim, Adonis Yim E, MD       losartan (COZAAR) tablet 50 mg  50 mg Oral Daily Leevy-Johnson, Brooke A, NP   50 mg at 10/04/21 0805   magnesium hydroxide (MILK OF MAGNESIA) suspension 30 mL  30 mL Oral Daily PRN Leevy-Johnson, Brooke A, NP   30 mL at 10/04/21 1714   multivitamin with minerals tablet 1 tablet  1 tablet Oral Daily Comer Locket, MD  1 tablet at 10/04/21 0953   nicotine (NICODERM CQ - dosed in mg/24 hours) patch 14 mg  14 mg Transdermal Daily Leevy-Johnson, Brooke A, NP   14 mg at 10/04/21 0805   ondansetron (ZOFRAN-ODT) disintegrating tablet 4 mg  4 mg Oral Q6H PRN Comer LocketSingleton, Pradyun Ishman E, MD       thiamine (Vitamin B-1) tablet 100 mg  100 mg Oral Daily Mason JimSingleton, Jadasia Haws E, MD        Lab Results: No results found for this or any previous visit (from the past 48 hour(s)).  Blood Alcohol level:  Lab Results  Component Value Date   ETH <10 10/02/2021    Metabolic Disorder Labs: No results found for: "HGBA1C", "MPG" No results found for: "PROLACTIN" Lab Results  Component Value Date   CHOL 221 (H) 12/19/2020   TRIG 99 12/19/2020   HDL 48 12/19/2020   CHOLHDL 4.6 12/19/2020   LDLCALC 152 (H) 12/19/2020   LDLCALC 139 (H) 12/17/2019    Physical Findings: AIMS: Facial and Oral Movements Muscles of Facial Expression: None, normal Lips and Perioral Area: None, normal Jaw: None, normal Tongue: None, normal,Extremity Movements Upper (arms, wrists, hands, fingers): None, normal Lower (legs, knees, ankles, toes): None, normal, Trunk Movements Neck, shoulders, hips:  None, normal, Overall Severity Severity of abnormal movements (highest score from questions above): None, normal Incapacitation due to abnormal movements: None, normal Patient's awareness of abnormal movements (rate only patient's report): No Awareness, Dental Status Current problems with teeth and/or dentures?: No Does patient usually wear dentures?: No  CIWA:  CIWA-Ar Total: 0  Musculoskeletal: Strength & Muscle Tone: within normal limits Gait & Station: normal Patient leans: N/A  Psychiatric Specialty Exam:  Presentation  General Appearance: Appropriate for Environment; Casual; Fairly Groomed  Eye Contact:Good  Speech:Clear and Coherent  Speech Volume:Normal  Handedness:Right   Mood and Affect  Mood:Described as less anxious - appears more euthymic  Affect:calm, polite, less anxious   Thought Process  Thought Processes:Coherent; Goal Directed  Descriptions of Associations:Intact  Orientation:Full (Time, Place and Person)  Thought Content:Denies SI, HI, AVH, paranoia, delusions, first rank symptoms, or ideas of reference - no obsessions/compulsions noted on exam  Hallucinations:Denied  Ideas of Reference:None  Suicidal Thoughts:Denied  Homicidal Thoughts:Denied   Sensorium  Memory:Intact  Judgment:Fair  Insight:Improving    Executive Functions  Concentration:Good  Attention Span:Good  Recall:Intact  Fund of Knowledge:Good  Language:Good   Psychomotor Activity  Psychomotor Activity:Psychomotor Activity: Normal   Assets  Assets:Communication Skills; Financial Resources/Insurance; Desire for Improvement; Housing; Physical Health; Vocational/Educational  Physical Exam Vitals and nursing note reviewed.  Constitutional:      Appearance: Normal appearance.  HENT:     Head: Normocephalic.  Pulmonary:     Effort: Pulmonary effort is normal.  Neurological:     General: No focal deficit present.     Mental Status: He is alert.     Review of Systems  Constitutional:  Negative for chills, diaphoresis and fever.  HENT:  Negative for congestion.   Respiratory:  Negative for shortness of breath.   Cardiovascular:  Negative for chest pain.  Gastrointestinal:  Negative for abdominal pain, constipation, diarrhea, nausea and vomiting.  Neurological:  Negative for dizziness and headaches.   Blood pressure (!) 118/91, pulse 86, temperature 98.4 F (36.9 C), temperature source Oral, resp. rate 16, height 5\' 8"  (1.727 m), weight 80.7 kg, SpO2 100 %. Body mass index is 27.06 kg/m.   Treatment Plan Summary: Diagnoses / Active Problems: MDD recurrent severe  without psychotic features (r/o SIMD) PTSD with panic attacks Alcohol use d/o Nicotine use d/o HIV HTN  PLAN: Safety and Monitoring:  -- Voluntary admission to inpatient psychiatric unit for safety, stabilization and treatment  -- Daily contact with patient to assess and evaluate symptoms and progress in treatment  -- Patient's case to be discussed in multi-disciplinary team meeting  -- Observation Level : q15 minute checks  -- Vital signs:  q12 hours  -- Precautions: suicide, elopement, and assault  2. Psychiatric Diagnoses and Treatment:   MDD recurrent severe without psychotic features (r/o SIMD) PTSD with panic attacks -- Continue Prozac 10mg  daily - titrating up as tolerated -- Will have patient complete MDQ -- Continue Neurontin 100mg  tid for anxiety -- Discussed option for PHP - would benefit from CBT after discharge -- TSH 4.047  Alcohol Use Disorder  -- Continue CIWA with Ativan 1mg  for scores >10 with oral thiamine and MVI replacement (recent CIWA score 0)  -- Discussed option for SAIOP/residential or outpatient SA treatment with patient and abstinence from alcohol encouraged   3. Medical Issues Being Addressed:   Tobacco Use Disorder  -- Nicotine patch 14mg /24 hours ordered  -- Smoking cessation encouraged    HTN  -- Continue home dose of  Cozaar 50mg  daily   Hyperlipidemia  -- Cholesterol 220 and LDL 142 - will discuss need for healthier diet, weight loss and increased exercise with patient - need to f/u with PCP after discharge   HIV  -- Continue home dose of Biktarvy daily     4. Discharge Planning:   -- Social work and case management to assist with discharge planning and identification of hospital follow-up needs prior to discharge  -- Estimated LOS: 5-7 days  -- Discharge Concerns: Need to establish a safety plan; Medication compliance and effectiveness  -- Discharge Goals: Return home with outpatient referrals for mental health follow-up including medication management/psychotherapy       Harlow Asa, MD, FAPA 10/05/2021, 8:17 AM

## 2021-10-05 NOTE — [facility] Group Notes (Signed)
Spiritual care group on grief and loss facilitated by chaplain Janne Napoleon, [facility]   Group Goal:   Support / Education around grief and loss   Members engage in facilitated group support and psycho-social education.   Group Description:   Following introductions and group rules, group members engaged in facilitated group dialog and support around topic of loss, with particular support around experiences of loss in their lives. Group Identified types of loss (relationships / self / things) and identified patterns, circumstances, and changes that precipitate losses. Reflected on thoughts / feelings around loss, normalized grief responses, and recognized variety in grief experience. Group noted Worden's four tasks of grief in discussion.   Group drew on Adlerian / Rogerian, narrative, MI,   Patient Progress: Ralph [name] attended group and actively engaged in the group conversation.  His comments showed insight and he was supportive of his peers.  [address] [place] Pager, [phone]

## 2021-10-05 NOTE — Plan of Care (Signed)
Mood Disorder Questionnaire- Bipolar Disorder Unlikely   Patient completed Mood Disorder Questionnaire this afternoon and  confirmed understanding to answer questions based on times of no substance or alcohol-use. Based on answers to questionnaire, patient is unlikely to have bipolar disorder. Pt answered yes to 5 statements (period of time he was not his usual self, so irritable that he shouted at people, thoughts raced through his head, easily distracted, and did unusual things that others may have thought to be excessive or risky) and does not meet criteria for further evaluation of bipolar disorder based on needing a yes to 7 statements or more. Though he endorsed yes to having several symptoms occurring during the same period of time and this being a moderate level problem, he clarified these responses were in regards to his alcohol-use which has been greater than his baseline in recent months. Pt reports this has not caused significant functional impairment for him as he continues to pay the bills and manage his finances.  Jerolyn Center, MS3

## 2021-10-05 NOTE — BH IP Treatment Plan (Signed)
Interdisciplinary Treatment and Diagnostic Plan Update  10/05/2021 Time of Session: 9:55am Ralph [name] MRN: [id number]  Principal Diagnosis: MDD (major depressive disorder), recurrent episode, severe ([place])  Secondary Diagnoses: Principal Problem:   MDD (major depressive disorder), recurrent episode, severe ([place]) Active Problems:   Human immunodeficiency virus (HIV) disease ([place])   Tobacco abuse   PTSD (post-traumatic stress disorder)   Panic attacks   Hypertension   Current Medications:  Current Facility-Administered Medications  Medication Dose Route Frequency Provider Last Rate Last Admin   acetaminophen (TYLENOL) tablet 650 mg  650 mg Oral Q6H PRN Leevy-Johnson, Brooke A, NP       alum & mag hydroxide-simeth (MAALOX/MYLANTA) 200-200-20 MG/5ML suspension 30 mL  30 mL Oral Q4H PRN Leevy-Johnson, Brooke A, NP       bictegravir-emtricitabine-tenofovir AF (BIKTARVY) 50-200-25 MG per tablet 1 tablet  1 tablet Oral Daily Leevy-Johnson, Brooke A, NP   1 tablet at 10/05/21 0829   FLUoxetine (PROZAC) capsule 10 mg  10 mg Oral Daily Nelda Marseille, Amy E, MD   10 mg at 10/05/21 0830   gabapentin (NEURONTIN) capsule 100 mg  100 mg Oral TID Harlow Asa, MD   100 mg at 10/05/21 0830   hydrOXYzine (ATARAX) tablet 25 mg  25 mg Oral Q6H PRN Nelda Marseille, Amy E, MD       loperamide (IMODIUM) capsule 2-4 mg  2-4 mg Oral PRN Nelda Marseille, Amy E, MD       LORazepam (ATIVAN) tablet 1 mg  1 mg Oral Q6H PRN Nelda Marseille, Amy E, MD       losartan (COZAAR) tablet 50 mg  50 mg Oral Daily Leevy-Johnson, Brooke A, NP   50 mg at 10/05/21 0830   magnesium hydroxide (MILK OF MAGNESIA) suspension 30 mL  30 mL Oral Daily PRN Leevy-Johnson, Brooke A, NP   30 mL at 10/04/21 1714   multivitamin with minerals tablet 1 tablet  1 tablet Oral Daily Viann Fish E, MD   1 tablet at 10/05/21 0831   nicotine (NICODERM CQ - dosed in mg/24 hours) patch 14 mg  14 mg Transdermal Daily Leevy-Johnson, Brooke A, NP   14 mg at  10/05/21 0831   ondansetron (ZOFRAN-ODT) disintegrating tablet 4 mg  4 mg Oral Q6H PRN Harlow Asa, MD       thiamine (Vitamin B-1) tablet 100 mg  100 mg Oral Daily Singleton, Amy E, MD   100 mg at 10/05/21 0830   PTA Medications: Medications Prior to Admission  Medication Sig Dispense Refill Last Dose   amphetamine-dextroamphetamine (ADDERALL) 20 MG tablet Take 20 mg by mouth 2 (two) times daily.   10/03/2021   bictegravir-emtricitabine-tenofovir AF (BIKTARVY) 50-200-25 MG TABS tablet Take 1 tablet by mouth daily. 30 tablet 11 10/03/2021   ibuprofen (ADVIL) 200 MG tablet Take 600 mg by mouth every 6 (six) hours as needed for moderate pain (as needed for foot pain).   Past Week   losartan (COZAAR) 50 MG tablet Take 50 mg by mouth daily.   10/03/2021   Multiple Vitamin (MULTIVITAMIN) tablet Take 1 tablet by mouth daily.   Past Month    Patient Stressors: Health problems   Occupational concerns   Substance abuse   Traumatic event    Patient Strengths: Capable of independent living  [employer] forces logistics/support/administrative officer  Motivation for treatment/growth  Supportive family/friends  Work skills   Treatment Modalities: Medication Management, Group therapy, Case management,  1 to 1 session with clinician, Psychoeducation, Recreational therapy.   Physician Treatment  Plan for Primary Diagnosis: MDD (major depressive disorder), recurrent episode, severe ([facility]) Long Term Goal(s): Improvement in symptoms so as ready for discharge   Short Term Goals: Ability to identify changes in lifestyle to reduce recurrence of condition will improve Ability to verbalize feelings will improve Ability to disclose and discuss suicidal ideas Ability to demonstrate self-control will improve Ability to identify and develop effective coping behaviors will improve Ability to maintain clinical measurements within normal limits will improve Compliance with prescribed medications will improve Ability to identify triggers  associated with substance abuse/mental health issues will improve  Medication Management: Evaluate patient's response, side effects, and tolerance of medication regimen.  Therapeutic Interventions: 1 to 1 sessions, Unit Group sessions and Medication administration.  Evaluation of Outcomes: Progressing  Physician Treatment Plan for Secondary Diagnosis: Principal Problem:   MDD (major depressive disorder), recurrent episode, severe ([facility]) Active Problems:   Human immunodeficiency virus (HIV) disease ([facility])   Tobacco abuse   PTSD (post-traumatic stress disorder)   Panic attacks   Hypertension  Long Term Goal(s): Improvement in symptoms so as ready for discharge   Short Term Goals: Ability to identify changes in lifestyle to reduce recurrence of condition will improve Ability to verbalize feelings will improve Ability to disclose and discuss suicidal ideas Ability to demonstrate self-control will improve Ability to identify and develop effective coping behaviors will improve Ability to maintain clinical measurements within normal limits will improve Compliance with prescribed medications will improve Ability to identify triggers associated with substance abuse/mental health issues will improve     Medication Management: Evaluate patient's response, side effects, and tolerance of medication regimen.  Therapeutic Interventions: 1 to 1 sessions, Unit Group sessions and Medication administration.  Evaluation of Outcomes: Progressing   RN Treatment Plan for Primary Diagnosis: MDD (major depressive disorder), recurrent episode, severe (HCC) Long Term Goal(s): Knowledge of disease and therapeutic regimen to maintain health will improve  Short Term Goals: Ability to remain free from injury will improve, Ability to verbalize frustration and anger appropriately will improve, Ability to demonstrate self-control, Ability to participate in decision making will improve, Ability to verbalize  feelings will improve, Ability to disclose and discuss suicidal ideas, Ability to identify and develop effective coping behaviors will improve, and Compliance with prescribed medications will improve  Medication Management: RN will administer medications as ordered by provider, will assess and evaluate patient's response and provide education to patient for prescribed medication. RN will report any adverse and/or side effects to prescribing provider.  Therapeutic Interventions: 1 on 1 counseling sessions, Psychoeducation, Medication administration, Evaluate responses to treatment, Monitor vital signs and CBGs as ordered, Perform/monitor CIWA, COWS, AIMS and Fall Risk screenings as ordered, Perform wound care treatments as ordered.  Evaluation of Outcomes: Progressing   LCSW Treatment Plan for Primary Diagnosis: MDD (major depressive disorder), recurrent episode, severe (HCC) Long Term Goal(s): Safe transition to appropriate next level of care at discharge, Engage patient in therapeutic group addressing interpersonal concerns.  Short Term Goals: Engage patient in aftercare planning with referrals and resources, Increase social support, Increase ability to appropriately verbalize feelings, Increase emotional regulation, Facilitate acceptance of mental health diagnosis and concerns, Facilitate patient progression through stages of change regarding substance use diagnoses and concerns, Identify triggers associated with mental health/substance abuse issues, and Increase skills for wellness and recovery  Therapeutic Interventions: Assess for all discharge needs, 1 to 1 time with Social worker, Explore available resources and support systems, Assess for adequacy in community support network, Educate family and significant  other(s) on suicide prevention, Complete Psychosocial Assessment, Interpersonal group therapy.  Evaluation of Outcomes: Progressing   Progress in Treatment: Attending groups:  Yes. Participating in groups: Yes. Taking medication as prescribed: Yes. Toleration medication: Yes. Family/Significant other contact made: No, will contact:   Emrick Hensch [phone]; father Patient understands diagnosis: Yes. Discussing patient identified problems/goals with staff: Yes. Medical problems stabilized or resolved: Yes. Denies suicidal/homicidal ideation: Yes. Issues/concerns per patient self-inventory: No.   New problem(s) identified: No, Describe:  none reported   New Short Term/Long Term Goal(s):   medication stabilization, elimination of SI thoughts, development of comprehensive mental wellness plan.    Patient Goals:  Patient states, "I want some resources to connect with someone when I am stressed or overwhelmed. I want to be connected to a therapist"  Discharge Plan or Barriers:  Patient recently admitted. CSW will continue to follow and assess for appropriate referrals and possible discharge planning.    Reason for Continuation of Hospitalization: Anxiety Depression Medication stabilization Suicidal ideation  Estimated Length of Stay: 3-7 days  Last 3 [place] Suicide Severity Risk Score: Flowsheet Row Ad[facility] (Current) [facility] 10[facility]02/2021 in [facility] from 08/11/2021 in [facility] RISK CATEGORY Moderate Risk Moderate Risk No Risk       Last PHQ 2/9 Scores:    06/19/2021    8:47 AM 12/19/2020    1:49 PM 06/17/2020   11:31 AM  Depression screen PHQ 2/9  Decreased Interest 0 0 0  Down, Depressed, Hopeless 0 0 0  PHQ - 2 Score 0 0 0    Scribe for Treatment Team: Beatris Si, LCSW 10/05/2021 10:41 AM

## 2021-10-05 NOTE — [facility] Group Notes (Signed)
Psychoeducational and Goals Group:     Purpose of Group: The groups focus is to identify and establish SMART goal for the shift. Group members participated in discussion about triggers for depression and anxiety. Participants were encouraged to share what they have learned and to actively listen to others.   Patient's Goal: Pt did not attend group.   

## 2021-10-05 NOTE — Progress Notes (Signed)
Psychoeducational Group Note  Date:  10/05/2021 Time:  2124  Group Topic/Focus:  Relapse Prevention Planning:   The focus of this group is to define relapse and discuss the need for planning to combat relapse.  Participation Level: Did Not Attend  Participation Quality:  Not Applicable  Affect:  Not Applicable  Cognitive:  Not Applicable  Insight:  Not Applicable  Engagement in Group: Not Applicable  Additional Comments:  The patient did not attend group this evening.   Archie Balboa S 10/05/2021, 9:24 PM

## 2021-10-05 NOTE — Progress Notes (Signed)
Pt did not attend all groups. Pt denies SI/HI/Self harm thoughts. Pt denies anxiety or racing thoughts this morning. Pt denies a/v hallucinations. Q 15 minute checks ongoing.

## 2021-10-06 DIAGNOSIS — F332 Major depressive disorder, recurrent severe without psychotic features: Secondary | ICD-10-CM | POA: Diagnosis not present

## 2021-10-06 MED ORDER — DOCUSATE SODIUM 100 MG PO CAPS
100.0000 mg | ORAL_CAPSULE | Freq: Every day | ORAL | Status: DC
  Administered 2021-10-06 – 2021-10-07 (×2): 100 mg via ORAL
  Filled 2021-10-06 (×3): qty 1

## 2021-10-06 NOTE — Progress Notes (Signed)
Ralph [name] requested to meet individually with chaplain after group yesterday.  Chaplain provided emotional support and listening as he shared about his difficulty adjusting to change.  He also shared the details that led up to him seeking support at the hospital. Chaplain normalized grief responses and affirmed his decision to seek help and support.  He is looking forward to connecting with a therapist for more long term support.  He feels lighter since being here and is grateful for the care he has received.  Of note, he lost a very close family friend in 2021 and her birthday is one day apart from his which was last week.   [address] [place] Pager, [phone]

## 2021-10-06 NOTE — Progress Notes (Addendum)
[facility] MD Progress Note  10/06/2021 11:06 AM Ralph [name]  MRN:  [id number]  Chief Complaint: SI  Reason for Admission:  Ralph [name] is a 31 y.o. male with a history of ADHD, who was admitted voluntarily for management of panic attacks, worsening depression, passive SI, and alcohol abuse.The patient is currently on Hospital Day 3.   Chart Review from last 24 hours:  The patient's chart was reviewed and nursing notes were reviewed. The patient's case was discussed in multidisciplinary team meeting. Per nursing, patient denies SI/HI/thoughts of self harm since 1430 yesterday. He had no acute behavioral issues and attended groups. Per [facility] he was compliant with scheduled medications and received his nicotine patch.  Information Obtained Today During Patient Interview: The patient was seen and evaluated on the unit. On assessment today the patient reports "feeling great" since starting Neurontin. He states he no longer feels drowsy and has been up since 5 am this morning. Patient reports that yesterday he slept for most of the day possibly due to the Neurontin but went outside to exercise and get fresh air. He reports that he has been socializing with peers and also attending group last night. He has been attending groups today and reports enjoying the social interactions. He reports good appetite and endorses mild constipation with some improvement with the MOM. He is amenable to starting colace for management. He state he slept great last night and denies any nightmares or intrusive thoughts.We reviewed his mood questionnaire results and patient understands that it is unlikely that he has bipolar disorder. He denies AVH, ideas of reference, first rank symptoms, or delusions. He denies SI or HI. Patient denies any alcohol cravings or withdrawal symptoms. He states the nicotine patch has been helpful and he is no longer wanting to vape at this time. We discussed therapeutic range for Prozac as 20-40  mg for most patients but patient prefers to stay at 10 mg for now as it is working for him. We discussed his hypercholesterolemia and patient reports he struggles with fast food and preparing meals at home. We talked at length about meal prepping and fresh meal delivery services along with the importance of exercising. Patient is aware that without these lifestyle changes, he may have to start on statin therapy as an outpatient. Discussed plans to have him work on his diet and exercise along with re-starting fish oil supplements to help with hypercholesterolemia.   We discussed option for PHP after discharge and patient is amenable to this and denies wanting alcohol use/cessation treatment.   Attempted to call mother of patient to collect collateral but she did not answer. Will attempt at a later time.   Principal Problem: MDD (major depressive disorder), recurrent episode, severe (HCC) Diagnosis: Principal Problem:   MDD (major depressive disorder), recurrent episode, severe (HCC) Active Problems:   Human immunodeficiency virus (HIV) disease (HCC)   Tobacco abuse   PTSD (post-traumatic stress disorder)   Panic attacks   Hypertension  Total Time Spent in Direct Patient Care:  I personally spent 30 minutes on the unit in direct patient care. The direct patient care time included face-to-face time with the patient, reviewing the patient's chart, communicating with other professionals, and coordinating care. Greater than 50% of this time was spent in counseling or coordinating care with the patient regarding goals of hospitalization, psycho-education, and discharge planning needs.  Past Psychiatric History: H/o ADHD on Adderall ; Previously on Wellbutrin for smoking cessation; denies other past psychotropic med trials.  He does not have a current therapist but did see one through the ID clinic in 2018. He has never seen a psychiatrist. This is his first inpatient admission.  He denies h/o SIB,  previous suicide attempts or previous inpatient admissions.  Past Medical History:  Past Medical History:  Diagnosis Date   Anxiety    Depression    HIV (human immunodeficiency virus infection) (HCC)    Hypertension     Past Surgical History:  Procedure Laterality Date   INNER EAR SURGERY      Family Psychiatric  History:  He states his paternal grandmother committed suicide and he is not sure if she had bipolar d/o or schizophrenia. He thinks depression runs on his maternal side of the family, addiction with alcohol runs on his paternal side in great aunts and uncles, and he is unsure about specific mental health issues on paternal side of the family.   Social History:  Social History   Substance and Sexual Activity  Alcohol Use Yes   Comment: occassional     Social History   Substance and Sexual Activity  Drug Use No    Social History   Socioeconomic History   Marital status: Single    Spouse name: Not on file   Number of children: Not on file   Years of education: Not on file   Highest education level: Not on file  Occupational History   Not on file  Tobacco Use   Smoking status: Former    Types: E-cigarettes   Smokeless tobacco: Never   Tobacco comments:    States he quit 3 months ago (06/19/21)  Substance and Sexual Activity   Alcohol use: Yes    Comment: occassional   Drug use: No   Sexual activity: Yes    Partners: Male    Birth control/protection: None    Comment: condoms declined  Other Topics Concern   Not on file  Social History Narrative   Not on file   Social Determinants of Health   Financial Resource Strain: Not on file  Food Insecurity: Not on file  Transportation Needs: Not on file  Physical Activity: Not on file  Stress: Not on file  Social Connections: Not on file   Sleep: Good per patient - 8 hours documented  Appetite:  Good  Current Medications: Current Facility-Administered Medications  Medication Dose Route Frequency  Provider Last Rate Last Admin   acetaminophen (TYLENOL) tablet 650 mg  650 mg Oral Q6H PRN Leevy-Johnson, Brooke A, NP       alum & mag hydroxide-simeth (MAALOX/MYLANTA) 200-200-20 MG/5ML suspension 30 mL  30 mL Oral Q4H PRN Leevy-Johnson, Brooke A, NP       bictegravir-emtricitabine-tenofovir AF (BIKTARVY) 50-200-25 MG per tablet 1 tablet  1 tablet Oral Daily Leevy-Johnson, Brooke A, NP   1 tablet at 10/06/21 0837   FLUoxetine (PROZAC) capsule 10 mg  10 mg Oral Daily Bartholomew Crews E, MD   10 mg at 10/06/21 0837   gabapentin (NEURONTIN) capsule 100 mg  100 mg Oral TID Comer Locket, MD   100 mg at 10/06/21 5035   hydrOXYzine (ATARAX) tablet 25 mg  25 mg Oral Q6H PRN Mason Jim, Gabbie Marzo E, MD       loperamide (IMODIUM) capsule 2-4 mg  2-4 mg Oral PRN Mason Jim, Zanden Colver E, MD       LORazepam (ATIVAN) tablet 1 mg  1 mg Oral Q6H PRN Comer Locket, MD       losartan (COZAAR)  tablet 50 mg  50 mg Oral Daily Leevy-Johnson, Brooke A, NP   50 mg at 10/06/21 0837   magnesium hydroxide (MILK OF MAGNESIA) suspension 30 mL  30 mL Oral Daily PRN Leevy-Johnson, Brooke A, NP   30 mL at 10/04/21 1714   multivitamin with minerals tablet 1 tablet  1 tablet Oral Daily Comer LocketSingleton, Yahaira Bruski E, MD   1 tablet at 10/06/21 0837   nicotine (NICODERM CQ - dosed in mg/24 hours) patch 14 mg  14 mg Transdermal Daily Leevy-Johnson, Brooke A, NP   14 mg at 10/06/21 0839   ondansetron (ZOFRAN-ODT) disintegrating tablet 4 mg  4 mg Oral Q6H PRN Comer LocketSingleton, Josclyn Rosales E, MD       thiamine (Vitamin B-1) tablet 100 mg  100 mg Oral Daily Mason JimSingleton, Mercedes Fort E, MD   100 mg at 10/06/21 [id number]    Lab Results:  Results for orders placed or performed during the hospital encounter of 10/03/21 (from the past 48 hour(s))  TSH     Status: None   Collection Time: 10/05/21  6:25 AM  Result Value Ref Range   TSH 4.047 0.350 - 4.500 uIU/mL    Comment: Performed by a 3rd Generation assay with a functional sensitivity of <=0.01 uIU/mL. Performed at [facility], [address] [place], [place] [address]   Lipid panel     Status: Abnormal   Collection Time: 10/05/21  6:25 AM  Result Value Ref Range   Cholesterol 220 (H) 0 - 200 mg/dL   Triglycerides 409123 <811<150 mg/dL   HDL 53 >91>40 mg/dL   Total CHOL/HDL Ratio 4.2 RATIO   VLDL 25 0 - 40 mg/dL   LDL Cholesterol 478142 (H) 0 - 99 mg/dL    Comment:        Total Cholesterol/HDL:CHD Risk Coronary Heart Disease Risk Table                     Men   Women  1/2 Average Risk   3.4   3.3  Average Risk       5.0   4.4  2 X Average Risk   9.6   7.1  3 X Average Risk  23.4   11.0        Use the calculated Patient Ratio above and the CHD Risk Table to determine the patient's CHD Risk.        ATP III CLASSIFICATION (LDL):  <100     mg/dL   Optimal  295-621100-129  mg/dL   Near or Above                    Optimal  130-159  mg/dL   Borderline  [id number]  mg/dL   High  >846>190     mg/dL   Very High Performed at [facility], [address] [place], [place] [address]   Hemoglobin A1c     Status: None   Collection Time: 10/05/21  6:25 AM  Result Value Ref Range   Hgb A1c MFr Bld 4.9 4.8 - 5.6 %    Comment: (NOTE) Pre diabetes:          5.7%-6.4%  Diabetes:              >6.4%  Glycemic control for   <7.0% adults with diabetes    Mean Plasma Glucose 93.93 mg/dL    Comment: Performed at [facility], [address] [place], [place] [address]  Blood Alcohol level:  Lab Results  Component Value Date   ETH <10 51/88/4166    Metabolic Disorder Labs: Lab Results  Component Value Date   HGBA1C 4.9 10/05/2021   MPG 93.93 10/05/2021   No results found for: "PROLACTIN" Lab Results  Component Value Date   CHOL 220 (H) 10/05/2021   TRIG 123 10/05/2021   HDL 53 10/05/2021   CHOLHDL 4.2 10/05/2021   VLDL 25 10/05/2021   LDLCALC 142 (H) 10/05/2021   LDLCALC 152 (H) 12/19/2020    Physical Findings: AIMS: Facial and Oral Movements Muscles of Facial Expression: None,  normal Lips and Perioral Area: None, normal Jaw: None, normal Tongue: None, normal,Extremity Movements Upper (arms, wrists, hands, fingers): None, normal Lower (legs, knees, ankles, toes): None, normal, Trunk Movements Neck, shoulders, hips: None, normal, Overall Severity Severity of abnormal movements (highest score from questions above): None, normal Incapacitation due to abnormal movements: None, normal Patient's awareness of abnormal movements (rate only patient's report): No Awareness, Dental Status Current problems with teeth and/or dentures?: No Does patient usually wear dentures?: No  CIWA:  CIWA-Ar Total: 0  Musculoskeletal: Strength & Muscle Tone: within normal limits Gait & Station: normal Patient leans: N/A  Psychiatric Specialty Exam:  Presentation  General Appearance: Appropriate for Environment; Casual; Fairly Groomed  Eye Contact:Good  Speech:Clear and Coherent  Speech Volume:Normal  Handedness:Right   Mood and Affect  Mood:Described as great- appears more euthymic  Affect: calm, polite, moderate, stable, full range  Thought Process  Thought Processes:Coherent; Goal Directed  Descriptions of Associations:Intact  Orientation:Full (Time, Place and Person)  Thought Content:Denies SI, HI, AVH, paranoia, delusions, first rank symptoms, or ideas of reference - no obsessions/compulsions noted on exam  Hallucinations:Denied  Ideas of Reference:None  Suicidal Thoughts:Denied  Homicidal Thoughts:Denied   Sensorium  Memory:Intact  Judgment: Intact on the unit  Insight:Improving    Executive Functions  Concentration:Good  Attention Span:Good  [place] of Knowledge:Good  Language:Good   Psychomotor Activity  Psychomotor Activity:Normal   Assets  Assets:Communication Skills; Financial Resources/Insurance; Desire for Improvement; Housing; Physical Health; Vocational/Educational  Physical Exam Vitals and nursing note  reviewed.  Constitutional:      Appearance: Normal appearance.  HENT:     Head: Normocephalic.  Pulmonary:     Effort: Pulmonary effort is normal.  Neurological:     General: No focal deficit present.     Mental Status: He is alert.    Review of Systems  Constitutional:  Negative for chills, diaphoresis and fever.  HENT:  Negative for congestion.   Respiratory:  Negative for shortness of breath.   Cardiovascular:  Negative for chest pain.  Gastrointestinal:  Negative for abdominal pain, constipation, diarrhea, nausea and vomiting.  Neurological:  Negative for dizziness and headaches.   Blood pressure 116/84, pulse 78, temperature 98 F (36.7 C), temperature source Oral, resp. rate 16, height 5\' 8"  (1.727 m), weight 80.7 kg, SpO2 100 %. Body mass index is 27.06 kg/m.   Treatment Plan Summary: Diagnoses / Active Problems: MDD recurrent severe without psychotic features (r/o SIMD) PTSD with panic attacks Alcohol use d/o Nicotine use d/o HIV HTN  PLAN: Safety and Monitoring:  -- Voluntary admission to inpatient psychiatric unit for safety, stabilization and treatment  -- Daily contact with patient to assess and evaluate symptoms and progress in treatment  -- Patient's case to be discussed in multi-disciplinary team meeting  -- Observation Level : q15 minute checks  -- Vital signs:  q12 hours  -- Precautions:  suicide, elopement, and assault  2. Psychiatric Diagnoses and Treatment:   MDD recurrent severe without psychotic features (r/o SIMD) PTSD with panic attacks -- Continue Prozac 10mg  daily - declines titrating up currently  -- Continue Neurontin 100mg  tid for anxiety -- Discussed option for PHP and SW to make referral -- MDQ negative screen for possible bipolar -- TSH 4.047  Alcohol Use Disorder  -- Continue CIWA with Ativan 1mg  for scores >10 with oral thiamine and MVI replacement (recent CIWA score 0)  -- Discussed option for SAIOP/residential or outpatient SA  treatment with patient and he declines - abstinence from alcohol encouraged   3. Medical Issues Being Addressed:   Tobacco Use Disorder  -- Nicotine patch 14mg /24 hours ordered  -- Smoking cessation encouraged    HTN  -- Continue home dose of Cozaar 50mg  daily   Hyperlipidemia  -- Cholesterol 220 and LDL 142 - discussed need for healthier diet, weight loss and increased exercise with patient - need to f/u with PCP after discharge and declines start at this time of statin - states will restart fish oil at home   HIV  -- Continue home dose of Biktarvy daily     4. Discharge Planning:   -- Social work and case management to assist with discharge planning and identification of hospital follow-up needs prior to discharge  -- Estimated LOS: 5-7 days  -- Discharge Concerns: Need to establish a safety plan; Medication compliance and effectiveness  -- Discharge Goals: Return home with outpatient referrals for mental health follow-up including medication management/psychotherapy   , Medical Student 10/06/2021, 11:06 AM  Attestation for Student Documentation:  I certify that I saw and interviewed the patient together with the medical student and was present for the duration of the interview.  I reviewed the medical record.  I performed or reperformed the mental status examination of the patient as indicated.  I formulated the assessment and plan of treatment as documented above with edits.  , MD, 07-07-1984

## 2021-10-06 NOTE — Progress Notes (Signed)
   10/06/21 0500  Sleep  Number of Hours 8
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
## 2021-10-06 NOTE — Progress Notes (Signed)
Wrap up group: List Positive thing about your day & 1 attribute about yourself Pt attended group as scheduled. Pt was appropriate with insight about discussion. Stated "Take action if you want to see positive changes. I'm doing the right thing , heading in the right direction for myself & my health".
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
## 2021-10-06 NOTE — Progress Notes (Addendum)
Pt stated he was feeling better due to the people here helping him   10/06/21 0200  Psych Admission Type (Psych Patients Only)  Admission Status Voluntary  Psychosocial Assessment  Patient Complaints None  Eye Contact Fair  Facial Expression Flat  Affect Anxious  Speech Logical/coherent  Interaction Minimal  Motor Activity Slow  Appearance/Hygiene Disheveled  Behavior Characteristics Cooperative  Mood Depressed  Aggressive Behavior  Effect No apparent injury  Thought Process  Coherency WDL  Content WDL  Delusions None reported or observed  Perception WDL  Hallucination None reported or observed  Judgment WDL  Confusion None  Danger to Self  Current suicidal ideation? Denies  Danger to Others  Danger to Others None reported or observed

## 2021-10-06 NOTE — Group Note (Signed)
Recreation Therapy Group Note   Group Topic:Animal Assisted Therapy   Group Date: 10/06/2021 Start Time: 5366 End Time: 1500 Facilitators: Victorino Sparrow, LRT,CTRS Location: [address] Dayroom   Animal-Assisted Activity (AAA) Program Checklist/Progress Notes Patient Eligibility Criteria Checklist & Daily Group note for Rec Tx Intervention  AAA/T Program Assumption of Risk Form signed by Patient/ or Parent Legal Guardian Yes  Patient is free of allergies or severe asthma Yes  Patient reports no fear of animals Yes  Patient reports no history of cruelty to animals Yes  Patient understands his/her participation is voluntary Yes  Patient washes hands before animal contact Yes  Patient washes hands after animal contact Yes   Affect/Mood: Appropriate   Participation Level: Minimal   Participation Quality: Independent   Behavior: Attentive     Clinical Observations/Individualized Feedback: Pt was quiet and observant of the therapy dog team.  Pt was attentive to the questions and tales of his peers.  Pt was appropriate during group session.   Plan: Continue to engage patient in RT group sessions 2-3x/week.   Victorino Sparrow, Glennis Brink  10/06/2021 3:49 PM

## 2021-10-06 NOTE — Discharge Summary (Signed)
Physician Discharge Summary Note  Patient:  Ralph [name] is an 31 y.o., male MRN:  [id number] DOB:  1990/08/10 Patient phone:  [phone] (home)  Patient address:   [place] [address],   Total Time Spent in Direct Patient Care:  I personally spent 35 minutes on the unit in direct patient care. The direct patient care time included face-to-face time with the patient, reviewing the patient's chart, communicating with other professionals, and coordinating care. Greater than 50% of this time was spent in counseling or coordinating care with the patient regarding goals of hospitalization, psycho-education, and discharge planning needs.  Date of Admission:  10/03/2021 Date of Discharge: 10/07/2021  Reason for Admission:  Ralph [name] is a 31 y.o. male with a history of ADHD, who was admitted voluntarily for management of panic attacks, worsening depression, passive SI, and alcohol abuse. See H&P for admission details.   Principal Problem: MDD (major depressive disorder), recurrent episode, severe ([place]) Discharge Diagnoses: Principal Problem:   MDD (major depressive disorder), recurrent episode, severe ([place]) Active Problems:   Human immunodeficiency virus (HIV) disease ([place])   Tobacco abuse   PTSD (post-traumatic stress disorder)   Panic attacks   Hypertension  Past Psychiatric History:  He states that he was diagnosed by a PCP with ADHD and has been on Adderall 20mg  daily. He previously took Wellbutrin for smoking cessation but denies other past psychotropic med trials. He does not have a current therapist but did see one through the ID clinic in 2018. He has never seen a psychiatrist.He denies h/o SIB, previous suicide attempts or previous inpatient admissions.  Past Medical History:  Past Medical History:  Diagnosis Date   Anxiety    Depression    HIV (human immunodeficiency virus infection) ([place])    Hypertension     Past Surgical History:   Procedure Laterality Date   INNER EAR SURGERY     Family History: see H&P  Family Psychiatric  History: He states his paternal grandmother committed suicide and he is not sure if she had bipolar d/o or schizophrenia. He thinks depression runs on his maternal side of the family, addiction with alcohol runs on his paternal side in great aunts and uncles, and he is unsure about specific mental health issues on paternal side of the family.  Social History:  Social History   Substance and Sexual Activity  Alcohol Use Yes   Comment: occassional     Social History   Substance and Sexual Activity  Drug Use No    Social History   Socioeconomic History   Marital status: Single    Spouse name: Not on file   Number of children: Not on file   Years of education: Not on file   Highest education level: Not on file  Occupational History   Not on file  Tobacco Use   Smoking status: Former    Types: E-cigarettes   Smokeless tobacco: Never   Tobacco comments:    States he quit 3 months ago (06/19/21)  Substance and Sexual Activity   Alcohol use: Yes    Comment: occassional   Drug use: No   Sexual activity: Yes    Partners: Male    Birth control/protection: None    Comment: condoms declined  Other Topics Concern   Not on file  Social History Narrative   Not on file   Social Determinants of Health   Financial Resource Strain: Not on file  Food Insecurity: Not on  file  Transportation Needs: Not on file  Physical Activity: Not on file  Stress: Not on file  Social Connections: Not on file    Hospital Course:  [facility] where he was observed on q15 minute safety checks and his care was discussed daily in multi-disciplinary team meeting.   On admission, he was started on CIWA protocol and observed for alcohol withdrawal. He was provided MVI and thiamine and had no complicated withdrawal issues during admission. He was encouraged to abstain from  alcohol and illicit drug use after discharge. He declined referral for SAIOP or residential rehab during admission. He was provided nicotine patch for nicotine cravings and was encouraged to work on smoking cessation at discharge. He was started on Prozac 10mg  daily for PTSD and depression and declined additional dose titration during admission. He was advised that his outpatient provider could determine over time if his Prozac needed dose adjustments after discharge. He was started on Neurontin 100mg  tid to help with anxiety and potential alcohol withdrawal/cravings. He tolerated all medications well and without noted side-effects. He was continued on home doses of Biktarvy and Cozaar for HIV and HTN management. He was given Colace and MOM for mild constipation. He was offered Vistaril PRN for anxiety.  With medication start, the patient was noted to be sleeping and eating well and participating in the milieu. He was interactive with peers and attending groups. At time of discharge he was agreeable to start of partial hospital program which was scheduled. He was encouraged to start outpatient psychotherapy and medication management in the community after completing PHP. Prior to discharge his suicidal thinking had resolved. He had no signs of mania, psychosis, or behavioral issues during admission. He was forward thinking and could articulate a safety plan at discharge.   During admission his labs were significant for elevated cholesterol to 220 and LDL 142 and he was counseled on need for healthier diet and increased exercise. He was advised to have his PCP manage and treat his hyperlipidemia and HTN as an outpaitnet. He was encouraged to f/u with ID for ongoing HIV management after discharge.  On day of discharge, he stated his anxiety was greatly reduced and his mood felt more stable. He denied SI, HI, AVH, paranoia, or delusions. He denied withdrawal or cravings for alcohol. He denied medication  side-effects and reported stable and improved mood. He could articulate a safety and discharge plan and voiced no physical complaints. He specifically reported having a BM. He was forward thinking and was encouraged to abstain from alcohol and illicit substance use after discharge. He was encouraged to work on smoking cessation. He was advised to see his PCP without fail after discharge for management of HTN and elevated lipids. Healthier diet and increased exercise were encouraged. He was advised to start PHP after discharge.  He was advised he may need dose titration up on his antidepressant after discharge and to discuss this with his outpatient provider.Time was given for questions.  Physical Findings: AIMS: Facial and Oral Movements Muscles of Facial Expression: None, normal Lips and Perioral Area: None, normal Jaw: None, normal Tongue: None, normal,Extremity Movements Upper (arms, wrists, hands, fingers): None, normal Lower (legs, knees, ankles, toes): None, normal, Trunk Movements Neck, shoulders, hips: None, normal, Overall Severity Severity of abnormal movements (highest score from questions above): None, normal Incapacitation due to abnormal movements: None, normal Patient's awareness of abnormal movements (rate only patient's report): No Awareness, Dental Status Current problems with  teeth and/or dentures?: No Does patient usually wear dentures?: No  CIWA:  CIWA-Ar Total: 1  Musculoskeletal: Strength & Muscle Tone: within normal limits Gait & Station: normal Patient leans: N/A   Psychiatric Specialty Exam   Presentation  General Appearance: Appropriate for Environment; Casual; Fairly Groomed   Eye Contact:Good   Speech:Clear and Coherent   Speech Volume:Normal   Mood and Affect  Mood:euthymic, hopeful   Affect:brighter, less anxious, moderate/stable     Thought Process  Thought Processes:Coherent; Goal Directed   Descriptions of Associations:Intact    Orientation:Full (Time, Place and Person)   Thought Content:Denies SI, HI, AVH, paranoia or delusions, no obsession/compulsions   Hallucinations:Denied   Ideas of Reference:None   Suicidal Thoughts:Denied   Homicidal Thoughts:Denied   Sensorium  Memory:Intact   Judgment:Improved   Insight:Present     Executive Functions  Concentration:Good   Attention Span:Good   Recall:Intact   Fund of Knowledge:Good   Language:Good     Psychomotor Activity  Psychomotor Activity:Normal   Assets  Assets:Communication Skills; Financial Resources/Insurance; Desire for Improvement; Housing; Physical Health; Vocational/Educational     Sleep  6 hours   Physical Exam Vitals and nursing note reviewed.  Constitutional:      Appearance: Normal appearance.  HENT:     Head: Normocephalic and atraumatic.  Pulmonary:     Effort: Pulmonary effort is normal.  Neurological:     General: No focal deficit present.     Mental Status: He is alert.      Review of Systems  Respiratory:  Negative for shortness of breath.   Cardiovascular:  Negative for chest pain.  Gastrointestinal:  Negative for constipation, diarrhea, nausea and vomiting.  Neurological:  Negative for dizziness and headaches.    Blood pressure (!) 117/93, pulse 85, temperature 98 F (36.7 C), temperature source Oral, resp. rate 16, height 5\' 8"  (1.727 m), weight 80.7 kg, SpO2 100 %. Body mass index is 27.06 kg/m.    Social History   Tobacco Use  Smoking Status Former   Types: E-cigarettes  Smokeless Tobacco Never  Tobacco Comments   States he quit 3 months ago (06/19/21)   Tobacco Cessation:  A prescription for an FDA-approved tobacco cessation medication provided at discharge   Blood Alcohol level:  Lab Results  Component Value Date   ETH <10 10/02/2021    Metabolic Disorder Labs:  Lab Results  Component Value Date   HGBA1C 4.9 10/05/2021   MPG 93.93 10/05/2021   No results found for:  "PROLACTIN" Lab Results  Component Value Date   CHOL 220 (H) 10/05/2021   TRIG 123 10/05/2021   HDL 53 10/05/2021   CHOLHDL 4.2 10/05/2021   VLDL 25 10/05/2021   LDLCALC 142 (H) 10/05/2021   LDLCALC 152 (H) 12/19/2020    See Psychiatric Specialty Exam and Suicide Risk Assessment completed by Attending Physician prior to discharge.  Discharge destination:  Home  Is patient on multiple antipsychotic therapies at discharge:  No   Has Patient had three or more failed trials of antipsychotic monotherapy by history:  No  Recommended Plan for Multiple Antipsychotic Therapies: NA   Allergies as of 10/07/2021   No Known Allergies      Medication List     STOP taking these medications    amphetamine-dextroamphetamine 20 MG tablet Commonly known as: ADDERALL   ibuprofen 200 MG tablet Commonly known as: ADVIL       TAKE these medications      Indication  Biktarvy 50-200-25 MG Tabs  tablet Generic drug: bictegravir-emtricitabine-tenofovir AF Take 1 tablet by mouth daily.  Indication: HIV Disease   docusate sodium 100 MG capsule Commonly known as: COLACE Take 1 capsule (100 mg total) by mouth daily as needed for mild constipation.  Indication: Constipation   FLUoxetine 10 MG capsule Commonly known as: PROZAC Take 1 capsule (10 mg total) by mouth daily.  Indication: Major Depressive Disorder   gabapentin 100 MG capsule Commonly known as: NEURONTIN Take 1 capsule (100 mg total) by mouth 3 (three) times daily.  Indication: Abuse or Misuse of Alcohol, anxiety   hydrOXYzine 25 MG tablet Commonly known as: ATARAX Take 1 tablet (25 mg total) by mouth every 6 (six) hours as needed for anxiety.  Indication: Feeling Anxious   losartan 50 MG tablet Commonly known as: COZAAR Take 50 mg by mouth daily.  Indication: High Blood Pressure Disorder   multivitamin tablet Take 1 tablet by mouth daily.  Indication: Nutritional Support   nicotine 14 mg/24hr patch Commonly  known as: NICODERM CQ - dosed in mg/24 hours Place 1 patch (14 mg total) onto the skin daily as needed (smoking cessation).  Indication: Nicotine Addiction        Follow-up Information     [facility] Follow up on 10/14/2021.   Why: You have an appointment for therapy services on 10/14/21 at 4:00 pm.  This appointment will be held Virtual. Contact information: [address] [place], [place] [address] [phone]         [facility] Follow up on 11/02/2021.   Why: You have an appointment for medication management services on 11/02/21 at  6:50 pm.  This will be a Virtual appointment. Contact information: [address] [place] [address] [phone]         BEHAVIORAL HEALTH PARTIAL HOSPITALIZATION PROGRAM Follow up on 10/09/2021.   Specialty: Behavioral Health Why: You are scheduled for an assessment for the PHP on Friday, 10/09/21 at 10:00 am. This appointment will last approximately one hour and will be virtual via Webex. PHP is virtual group therapy that runs Mon-Fri from 9am-1pm. Please download the [employer] app prior to the appointment. If you need to cancel or reschedule, please call [phone] Contact information: [address] [place] [address] [phone]                Follow-up recommendations:   Activity:  as tolerated Diet:  heart healthy Other:  Patient was advised to comply with scheduled Partial Hospitalization Program, medications, and outpatient therapy and medication management appointments. He was advised to abstain from alcohol and illict drug use and to work on smoking cessation. He was advised to see his primary care physician without fail for management of his elevated cholesterol and hypertension.  He should follow up with infectious disease for ongoing management of his HIV.  Signed: Comer Locket, MD, FAPA 10/07/2021, 7:55  AM

## 2021-10-06 NOTE — Progress Notes (Signed)
   10/06/21 0900  Psych Admission Type (Psych Patients Only)  Admission Status Voluntary  Psychosocial Assessment  Patient Complaints None  Eye Contact Fair  Facial Expression Flat  Affect Anxious  Speech Logical/coherent  Interaction Assertive  Motor Activity Other (Comment) (Unremarkable.)  Appearance/Hygiene Unremarkable  Behavior Characteristics Cooperative  Mood Pleasant  Thought Process  Coherency WDL  Content WDL  Delusions None reported or observed  Perception WDL  Hallucination None reported or observed  Judgment WDL  Confusion None  Danger to Self  Current suicidal ideation? Denies  Self-Injurious Behavior No self-injurious ideation or behavior indicators observed or expressed   Agreement Not to Harm Self Yes  Description of Agreement Verbal.  Danger to Others  Danger to Others None reported or observed

## 2021-10-06 NOTE — Plan of Care (Signed)
Called collateral and spoke with patient's mother who reports she last saw him on Sunday and reported his mood to be "cheerful." She states he seemed "more positive" than his baseline. Mother states alcohol supply was removed from patient's home environment by the father and he does not have access to firearms that she is aware of at this time. We discussed potential discharge tomorrow or Thursday. Informed mother that this is a fragile time for most patients since inpatient hospital time is very structured and the transition back to home can be challenging. Mother said she understands and will make sure she and or father frequently check-in on patient. We discussed risk of SI after discharge during transition time and ensuring good social support for patient once he returns home. Mother is aware that patient will start PHP to help with transition after inpatient hospitalization. Mother was concerned about patient's alcohol use and was informed that patient may discuss more with her regarding this once he is discharged.

## 2021-10-06 NOTE — Group Note (Signed)
Date:  10/06/2021 Time:  5:15 PM  Group Topic/Focus:  Orientation:   The focus of this group is to educate the patient on the purpose and policies of crisis stabilization and provide a format to answer questions about their admission.  The group details unit policies and expectations of patients while admitted.    Participation Level:  Minimal  Participation Quality:  Appropriate  Affect:  Appropriate  Cognitive:  Appropriate  Insight: Appropriate  Engagement in Group:  Improving  Modes of Intervention:  Discussion  Additional Comments:     Jerrye Beavers 10/06/2021, 5:15 PM

## 2021-10-07 DIAGNOSIS — F332 Major depressive disorder, recurrent severe without psychotic features: Secondary | ICD-10-CM

## 2021-10-07 MED ORDER — HYDROXYZINE HCL 25 MG PO TABS: 25.0000 mg | ORAL_TABLET | Freq: Four times a day (QID) | ORAL | 0 refills | Status: DC | PRN

## 2021-10-07 MED ORDER — NICOTINE 14 MG/24HR TD PT24: 14.0000 mg | MEDICATED_PATCH | Freq: Every day | TRANSDERMAL | 0 refills | Status: DC | PRN

## 2021-10-07 MED ORDER — FLUOXETINE HCL 10 MG PO CAPS: 10.0000 mg | ORAL_CAPSULE | Freq: Every day | ORAL | 0 refills | Status: DC

## 2021-10-07 MED ORDER — GABAPENTIN 100 MG PO CAPS: 100.0000 mg | ORAL_CAPSULE | Freq: Three times a day (TID) | ORAL | 0 refills | Status: DC

## 2021-10-07 MED ORDER — DOCUSATE SODIUM 100 MG PO CAPS: 100.0000 mg | ORAL_CAPSULE | Freq: Every day | ORAL | 0 refills | Status: AC | PRN

## 2021-10-07 NOTE — Progress Notes (Signed)
Pt was educated on discharge. Pt was given discharge papers. Copy of safety plan placed in chart. Pt was satisfied all belongings were returned. Pt was discharged to lobby.  

## 2021-10-07 NOTE — Plan of Care (Signed)
  Problem: Education: Goal: Knowledge of [facility] Education information/materials will improve Outcome: Progressing Goal: Emotional status will improve Outcome: Progressing Goal: Mental status will improve Outcome: Progressing Goal: Verbalization of understanding the information provided will improve Outcome: Progressing   Problem: Activity: Goal: Interest or engagement in activities will improve Outcome: Progressing Goal: Sleeping patterns will improve Outcome: Progressing   Problem: Coping: Goal: Ability to verbalize frustrations and anger appropriately will improve Outcome: Progressing Goal: Ability to demonstrate self-control will improve Outcome: Progressing   Problem: Health Behavior/Discharge Planning: Goal: Identification of resources available to assist in meeting health care needs will improve Outcome: Progressing Goal: Compliance with treatment plan for underlying cause of condition will improve Outcome: Progressing   Problem: Physical Regulation: Goal: Ability to maintain clinical measurements within normal limits will improve Outcome: Progressing   Problem: Safety: Goal: Periods of time without injury will increase Outcome: Progressing   Problem: Education: Goal: Ability to make informed decisions regarding treatment will improve Outcome: Progressing   Problem: Health Behavior/Discharge Planning: Goal: Identification of resources available to assist in meeting health care needs will improve Outcome: Progressing   Problem: Medication: Goal: Compliance with prescribed medication regimen will improve Outcome: Progressing   Problem: Education: Goal: Utilization of techniques to improve thought processes will improve Outcome: Progressing Goal: Knowledge of the prescribed therapeutic regimen will improve Outcome: Progressing   Problem: Activity: Goal: Interest or engagement in leisure activities will improve Outcome: Progressing Goal:  Imbalance in normal sleep/wake cycle will improve Outcome: Progressing   Problem: Coping: Goal: Coping ability will improve Outcome: Progressing Goal: Will verbalize feelings Outcome: Progressing   Problem: Health Behavior/Discharge Planning: Goal: Ability to make decisions will improve Outcome: Progressing Goal: Compliance with therapeutic regimen will improve Outcome: Progressing   Problem: Role Relationship: Goal: Will demonstrate positive changes in social behaviors and relationships Outcome: Progressing   Problem: Safety: Goal: Ability to disclose and discuss suicidal ideas will improve Outcome: Progressing Goal: Ability to identify and utilize support systems that promote safety will improve Outcome: Progressing   Problem: Self-Concept: Goal: Will verbalize positive feelings about self Outcome: Progressing Goal: Level of anxiety will decrease Outcome: Progressing

## 2021-10-07 NOTE — Progress Notes (Signed)
  [facility] Adult Case Management Discharge Plan :  Will you be returning to the same living situation after discharge:  Yes,  Home At discharge, do you have transportation home?: Yes,  Father  Do you have the ability to pay for your medications: Yes,  [employer]   Release of information consent forms completed and in the chart;  Patient's signature needed at discharge.  Patient to Follow up at:  [facility] 10[facility]14/2021.   Why: You have an appointment for therapy services on 10/14/21 at 4:00 pm.  This appointment will be held Virtual. Contact information: [address] [place], [place] [address] [facility] Follow up on 11/02/2021.   Why: You have an appointment for medication management services on 11/02/21 at  6:50 pm.  This will be a Virtual appointment. Contact information: [place] [address] [phone]         BEHAVIORAL HEALTH PARTIAL HOSPITALIZATION PROGRAM Follow up on 10/09/2021.   Specialty: Behavioral Health Why: You are scheduled for an assessment for the PHP on Friday, 10/09/21 at 10:00 am. This appointment will last approximately one hour and will be virtual via Webex. PHP is virtual group therapy that runs Mon-Fri from 9am-1pm. Please download the [employer] app prior to the appointment. If you need to cancel or reschedule, please call [phone] Contact information: [place] [phone]                Next level of care provider has access to [place] and Suicide Prevention discussed: Yes,  with patient and father      Has patient been referred to the Quitline?: N/A patient is not a smoker  Patient has been referred for addiction treatment: N/A  Darleen Crocker, [place] 10/07/2021, 9:28 AM

## 2021-10-07 NOTE — Group Note (Signed)
Recreation Therapy Group Note   Group Topic:Stress Management  Group Date: 10/07/2021 Start Time: 0930 End Time: 1000 Facilitators: Victorino Sparrow, LRT,CTRS Location: [address] Dayroom   Goal Area(s) Addresses:  Patient will actively participate in stress management techniques presented during session.  Patient will successfully identify benefit of practicing stress management post d/c.   Group Description: Guided Imagery. LRT provided education, instruction, and demonstration on practice of visualization via guided imagery. Patient was asked to participate in the technique introduced during session. LRT debriefed including topics of mindfulness, stress management and specific scenarios each patient could use these techniques. Patients were given suggestions of ways to access scripts post d/c and encouraged to explore Youtube and other apps available on smartphones, tablets, and computers.   Clinical Observations/Feedback:  LRT was unable to hold group due to miscommunication with MHT about group time.   Plan: Continue to engage patient in RT group sessions 2-3x/week.   Victorino Sparrow, LRT,CTRS 10/07/2021 12:40 PM

## 2021-10-07 NOTE — Progress Notes (Signed)
Patient appears pleasant. Patient denies SI/HI/AVH. Pt goal for the day is "starting off the day positive with the tools provided to me by this hospital". Patient complied with morning medication with no reported side effects. Pt reports good sleep and appetite. Patient remains safe on Q84min checks and contracts for safety.       10/07/21 0929  Psych Admission Type (Psych Patients Only)  Admission Status Voluntary  Psychosocial Assessment  Patient Complaints None  Eye Contact Fair  Facial Expression Flat  Affect Anxious  Speech Logical/coherent  Interaction Minimal  Motor Activity Slow  Appearance/Hygiene Unremarkable  Behavior Characteristics Cooperative;Anxious  Mood Pleasant  Thought Process  Coherency WDL  Content WDL  Delusions None reported or observed  Perception WDL  Hallucination None reported or observed  Judgment WDL  Confusion WDL  Danger to Self  Current suicidal ideation? Denies  Self-Injurious Behavior No self-injurious ideation or behavior indicators observed or expressed   Agreement Not to Harm Self Yes  Description of Agreement verbal  Danger to Others  Danger to Others None reported or observed

## 2021-10-07 NOTE — Progress Notes (Signed)
Adult Psychoeducational Group Note  Date:  10/07/2021 Time:  11:21 AM  Group Topic/Focus:  Goals Group:   The focus of this group is to help patients establish daily goals to achieve during treatment and discuss how the patient can incorporate goal setting into their daily lives to aide in recovery.  Participation Level:  Active  Participation Quality:  Appropriate  Affect:  Appropriate  Cognitive:  Appropriate  Insight: Appropriate  Engagement in Group:  Engaged  Modes of Intervention:  Discussion  Additional Comments:  Patient attended morning orientation/goal setting group and participated.  Kass Herberger W Anav Lammert 4/69/6295, 11:21 AM

## 2021-10-09 ENCOUNTER — Other Ambulatory Visit (HOSPITAL_COMMUNITY): Payer: BC Managed Care – PPO | Attending: Psychiatry | Admitting: Licensed Clinical Social Worker

## 2021-10-09 ENCOUNTER — Encounter (HOSPITAL_COMMUNITY): Payer: Self-pay

## 2021-10-09 DIAGNOSIS — F411 Generalized anxiety disorder: Secondary | ICD-10-CM

## 2021-10-09 DIAGNOSIS — F332 Major depressive disorder, recurrent severe without psychotic features: Secondary | ICD-10-CM
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
## 2021-10-09 NOTE — Plan of Care (Signed)
  Problem: Depression CCP Problem  1 Learn and Apply Coping Skills to Decrease Depression Symptoms   Goal: LTG: Ralph [name] WILL SCORE LESS THAN 10 ON THE PATIENT HEALTH QUESTIONNAIRE (PHQ-9) Outcome: Not Applicable   Problem: Depression CCP Problem  1 Learn and Apply Coping Skills to Decrease Depression Symptoms   Goal: STG: Ralph [name] WILL ATTEND AT LEAST 80% OF SCHEDULED PHP SESSIONS Outcome: Not Applicable   Problem: Depression CCP Problem  1 Learn and Apply Coping Skills to Decrease Depression Symptoms   Goal: STG: Ralph [name] WILL ATTEND AT LEAST 80% OF SCHEDULED GROUP PSYCHOTHERAPY SESSIONS Outcome: Not Applicable   Problem: Depression CCP Problem  1 Learn and Apply Coping Skills to Decrease Depression Symptoms   Goal: STG: Ralph [name] WILL COMPLETE AT LEAST 80% OF ASSIGNED HOMEWORK Outcome: Not Applicable   Problem: Depression CCP Problem  1 Learn and Apply Coping Skills to Decrease Depression Symptoms   Goal: STG: Ralph [name] WILL IDENTIFY AT LEAST 3 COGNITIVE PATTERNS AND BELIEFS THAT SUPPORT DEPRESSION Outcome: Not Applicable   Problem: Anxiety Disorder CCP Problem  1 Learn and Apply Coping Skills to Decrease Anxiety Symptoms   Goal: LTG: Patient will score less than 5 on the Generalized Anxiety Disorder 7 Scale (GAD-7) Outcome: Not Applicable   Problem: Anxiety Disorder CCP Problem  1 Learn and Apply Coping Skills to Decrease Anxiety Symptoms   Goal: STG: Report a decrease in anxiety symptoms as evidenced by an overall reduction in anxiety score by a minimum of 25% on the Generalized Anxiety Disorder Scale Outcome: Not Applicable   Problem: Anxiety Disorder CCP Problem  1 Learn and Apply Coping Skills to Decrease Anxiety Symptoms   Goal: STG: Patient will reduce frequency of avoidant behaviors by 50% as evidenced by self-report in therapy sessions Outcome: Not Applicable

## 2021-10-13 ENCOUNTER — Telehealth (HOSPITAL_COMMUNITY): Payer: Self-pay | Admitting: Licensed Clinical Social Worker

## 2021-12-18 ENCOUNTER — Ambulatory Visit (INDEPENDENT_AMBULATORY_CARE_PROVIDER_SITE_OTHER): Payer: BC Managed Care – PPO | Admitting: Internal Medicine

## 2021-12-18 ENCOUNTER — Encounter: Payer: Self-pay | Admitting: Internal Medicine

## 2021-12-18 ENCOUNTER — Other Ambulatory Visit: Payer: Self-pay

## 2021-12-18 VITALS — BP 128/87 | HR 97 | Resp 16 | Ht 68.0 in | Wt 178.0 lb

## 2021-12-18 DIAGNOSIS — Z113 Encounter for screening for infections with a predominantly sexual mode of transmission: Secondary | ICD-10-CM

## 2021-12-18 DIAGNOSIS — B20 Human immunodeficiency virus [HIV] disease: Secondary | ICD-10-CM | POA: Diagnosis not present

## 2021-12-18 DIAGNOSIS — Z79899 Other long term (current) drug therapy: Secondary | ICD-10-CM | POA: Diagnosis not present

## 2021-12-18 NOTE — Assessment & Plan Note (Signed)
He is doing well on Biktarvy and no changes indicated.  Will confirm with labs today and he can rtc in 6 months.

## 2021-12-18 NOTE — Assessment & Plan Note (Signed)
Lipid panel reviewed with him.  Lifestyle modifications recommended.

## 2021-12-18 NOTE — Assessment & Plan Note (Signed)
Will screen.  Low risk at this time.

## 2021-12-18 NOTE — Progress Notes (Signed)
   Subjective:    Patient ID: Ralph [name], male    DOB: November 15, 1990, 31 y.o.   MRN: [id number]  HPI Ralph [name] is here for follow up of HIV He continues on [place] and has had no issues since his last visit.  No missed doses or new concerns.  He was recently in [facility] for suicidal ideation with depression and anxiety and now on new medications.  He feels he is doing much better from that standpoint.  He continues to follow with counseling and psychiatry.     Review of Systems  Constitutional:  Negative for fatigue.  Gastrointestinal:  Negative for diarrhea.  Skin:  Negative for rash.  Psychiatric/Behavioral:  Negative for sleep disturbance and suicidal ideas.        Objective:   Physical Exam Eyes:     General: No scleral icterus. Pulmonary:     Effort: Pulmonary effort is normal.  Skin:    Findings: No rash.  Neurological:     General: No focal deficit present.     Mental Status: He is alert.  Psychiatric:        Mood and Affect: Mood normal.   SH; + tobacco        Assessment & Plan:

## 2021-12-21 LAB — CBC WITH DIFFERENTIAL/PLATELET
Absolute Monocytes: 590 cells/uL (ref 200–950)
Basophils Absolute: 80 cells/uL (ref 0–200)
Basophils Relative: 1.2 %
Eosinophils Absolute: 188 cells/uL (ref 15–500)
Eosinophils Relative: 2.8 %
HCT: 44.6 % (ref 38.5–50.0)
Hemoglobin: 15.9 g/dL (ref 13.2–17.1)
Lymphs Abs: 1956 cells/uL (ref 850–3900)
MCH: 33 pg (ref 27.0–33.0)
MCHC: 35.7 g/dL (ref 32.0–36.0)
MCV: 92.5 fL (ref 80.0–100.0)
MPV: 9.2 fL (ref 7.5–12.5)
Monocytes Relative: 8.8 %
Neutro Abs: 3886 cells/uL (ref 1500–7800)
Neutrophils Relative %: 58 %
Platelets: 251 10*3/uL (ref 140–400)
RBC: 4.82 10*6/uL (ref 4.20–5.80)
RDW: 12.4 % (ref 11.0–15.0)
Total Lymphocyte: 29.2 %
WBC: 6.7 10*3/uL (ref 3.8–10.8)

## 2021-12-21 LAB — COMPLETE METABOLIC PANEL WITH GFR
AG Ratio: 1.7 (calc) (ref 1.0–2.5)
ALT: 26 U/L (ref 9–46)
AST: 22 U/L (ref 10–40)
Albumin: 4.5 g/dL (ref 3.6–5.1)
Alkaline phosphatase (APISO): 81 U/L (ref 36–130)
BUN: 9 mg/dL (ref 7–25)
CO2: 26 mmol/L (ref 20–32)
Calcium: 9.4 mg/dL (ref 8.6–10.3)
Chloride: 105 mmol/L (ref 98–110)
Creat: 0.97 mg/dL (ref 0.60–1.26)
Globulin: 2.6 g/dL (calc) (ref 1.9–3.7)
Glucose, Bld: 98 mg/dL (ref 65–99)
Potassium: 4.2 mmol/L (ref 3.5–5.3)
Sodium: 140 mmol/L (ref 135–146)
Total Bilirubin: 0.7 mg/dL (ref 0.2–1.2)
Total Protein: 7.1 g/dL (ref 6.1–8.1)
eGFR: 106 mL/min/{1.73_m2} (ref >=60)

## 2021-12-21 LAB — T-HELPER CELLS (CD4) COUNT (NOT AT ARMC)
Absolute CD4: 891 cells/uL (ref 490–1740)
CD4 T Helper %: 46 % (ref 30–61)
Total lymphocyte count: 1951 cells/uL (ref 850–3900)

## 2021-12-21 LAB — C. TRACHOMATIS/N. GONORRHOEAE RNA
C. trachomatis RNA, TMA: NOT DETECTED
N. gonorrhoeae RNA, TMA: NOT DETECTED

## 2021-12-21 LAB — RPR: RPR Ser Ql: NONREACTIVE

## 2021-12-21 LAB — HIV-1 RNA QUANT-NO REFLEX-BLD
HIV 1 RNA Quant: NOT DETECTED Copies/mL
HIV-1 RNA Quant, Log: NOT DETECTED Log cps/mL

## 2021-12-26 ENCOUNTER — Ambulatory Visit
Admission: EM | Admit: 2021-12-26 | Discharge: 2021-12-26 | Disposition: A | Discharge status: Home / Self Care | Payer: BC Managed Care – PPO | Attending: Nurse Practitioner | Admitting: Nurse Practitioner

## 2021-12-26 ENCOUNTER — Encounter: Payer: Self-pay | Admitting: Emergency Medicine

## 2021-12-26 DIAGNOSIS — B349 Viral infection, unspecified: Secondary | ICD-10-CM | POA: Diagnosis present

## 2021-12-26 DIAGNOSIS — Z20822 Contact with and (suspected) exposure to covid-19: Secondary | ICD-10-CM | POA: Insufficient documentation

## 2021-12-26 LAB — RESP PANEL BY RT-PCR (FLU A&B, COVID) ARPGX2
Influenza A by PCR: NEGATIVE
Influenza B by PCR: NEGATIVE
SARS Coronavirus 2 by RT PCR: NEGATIVE

## 2021-12-26 NOTE — ED Provider Notes (Signed)
[facility]    CSN: [id number] Arrival date & time: 12/26/21  1303      History   Chief Complaint Chief Complaint  Patient presents with   Fever   Generalized Body Aches   Nausea    HPI Ralph [name] is a 31 y.o. male.   The history is provided by the patient.   Patient presents for complaints of fever, body aches, dizziness, headache, and vomiting that started over the past 24 hours.  Patient states that while he was at work last evening, he noticed he was not feeling well, and ended up being seen by the nurse.  He was told by that time that he had a fever, and she recommended that he get checked for COVID and flu.  Patient denies sore throat, ear pain, cough, wheezing, shortness of breath, difficulty breathing, or abdominal pain.  Patient reports that he has been taking over-the-counter cough and cold medication for his symptoms.  He denies any known sick contacts.  Patient reports that he recently had an upper respiratory infection.  Past Medical History:  Diagnosis Date   Anxiety    Depression    HIV (human immunodeficiency virus infection) ([place])    Hypertension     Patient Active Problem List   Diagnosis Date Noted   GAD (generalized anxiety disorder) 10/09/2021   PTSD (post-traumatic stress disorder) 10/04/2021   Panic attacks 10/04/2021   Hypertension 10/04/2021   MDD (major depressive disorder), recurrent episode, severe ([place]) 10/03/2021   Major depressive disorder, recurrent episode, severe ([place])    Anxiety 06/02/2017   Tobacco abuse 02/01/2017   Medication monitoring encounter 10/28/2016   Human immunodeficiency virus (HIV) disease ([place]) 09/22/2016   Screening examination for venereal disease 09/22/2016   Encounter for long-term (current) use of medications 09/22/2016    Past Surgical History:  Procedure Laterality Date   INNER EAR SURGERY         Home Medications    Prior to Admission medications   Medication Sig Start Date End  Date Taking? Authorizing Provider  bictegravir-emtricitabine-tenofovir AF (BIKTARVY) 50-200-25 MG TABS tablet Take 1 tablet by mouth daily. 06/19/21   Comer, Okey Regal, MD  docusate sodium (COLACE) 100 MG capsule Take 1 capsule (100 mg total) by mouth daily as needed for mild constipation. 10/07/21   Harlow Asa, MD  FLUoxetine (PROZAC) 10 MG capsule Take 1 capsule (10 mg total) by mouth daily. 10/07/21   Harlow Asa, MD  gabapentin (NEURONTIN) 100 MG capsule Take 1 capsule (100 mg total) by mouth 3 (three) times daily. 10/07/21   Harlow Asa, MD  hydrOXYzine (ATARAX) 25 MG tablet Take 1 tablet (25 mg total) by mouth every 6 (six) hours as needed for anxiety. 10/07/21   Harlow Asa, MD  losartan (COZAAR) 50 MG tablet Take 50 mg by mouth daily. 09/22/21   [provider]  Multiple Vitamin (MULTIVITAMIN) tablet Take 1 tablet by mouth daily.    [provider]  nicotine (NICODERM CQ - DOSED IN MG/24 HOURS) 14 mg/24hr patch Place 1 patch (14 mg total) onto the skin daily as needed (smoking cessation). 10/07/21   Harlow Asa, MD    Family History History reviewed. No pertinent family history.  Social History Social History   Tobacco Use   Smoking status: Former    Types: E-cigarettes   Smokeless tobacco: Never   Tobacco comments:    States he quit 3 months ago (06/19/21)  Substance Use Topics  Alcohol use: Yes    Comment: occassional   Drug use: No     Allergies   Patient has no known allergies.   Review of Systems Review of Systems Per HPI  Physical Exam Triage Vital Signs ED Triage Vitals [12/26/21 1402]  Enc Vitals Group     BP 126/83     Pulse Rate 96     Resp 18     Temp 98.3 F (36.8 C)     Temp Source Oral     SpO2 97 %     Weight      Height      Head Circumference      Peak Flow      Pain Score 5     Pain Loc      Pain Edu?      Excl. in [place]?    No data found.  Updated Vital Signs BP 126/83 (BP Location: Right Arm)    Pulse 96   Temp 98.3 F (36.8 C) (Oral)   Resp 18   SpO2 97%   Visual Acuity Right Eye Distance:   Left Eye Distance:   Bilateral Distance:    Right Eye Near:   Left Eye Near:    Bilateral Near:     Physical Exam Vitals and nursing note reviewed.  Constitutional:      General: He is not in acute distress.    Appearance: Normal appearance.  HENT:     Head: Normocephalic.     Right Ear: Tympanic membrane, ear canal and external ear normal.     Left Ear: Tympanic membrane, ear canal and external ear normal.     Mouth/Throat:     Mouth: Mucous membranes are moist.     Pharynx: Posterior oropharyngeal erythema present. No oropharyngeal exudate.  Eyes:     Conjunctiva/sclera: Conjunctivae normal.     Pupils: Pupils are equal, round, and reactive to light.  Cardiovascular:     Rate and Rhythm: Regular rhythm.     Pulses: Normal pulses.     Heart sounds: Normal heart sounds.  Pulmonary:     Effort: Pulmonary effort is normal. No respiratory distress.     Breath sounds: Normal breath sounds. No stridor. No wheezing, rhonchi or rales.  Abdominal:     General: Bowel sounds are normal.     Palpations: Abdomen is soft.     Tenderness: There is no abdominal tenderness.  Musculoskeletal:     Cervical back: Normal range of motion.  Lymphadenopathy:     Cervical: No cervical adenopathy.  Skin:    General: Skin is warm and dry.  Neurological:     General: No focal deficit present.     Mental Status: He is alert and oriented to person, place, and time.  Psychiatric:        Mood and Affect: Mood normal.        Behavior: Behavior normal.      UC Treatments / Results  Labs (all labs ordered are listed, but only abnormal results are displayed) Labs Reviewed  RESP PANEL BY RT-PCR (FLU A&B, COVID) [id number]    EKG   Radiology No results found.  Procedures Procedures (including critical care time)  Medications Ordered in UC Medications - No data to display  Initial  Impression / Assessment and Plan / UC Course  I have reviewed the triage vital signs and the nursing notes.  Pertinent labs & imaging results that were available during my care of the patient were  reviewed by me and considered in my medical decision making (see chart for details).  Patient is well-appearing, he is in no acute distress, vital signs are stable.  COVID/flu test is pending.  Suspect symptoms are of a viral etiology.  Supportive care recommendations were provided to the patient to include increasing fluids, getting plenty of rest, and warm salt water gargles.  Patient is a candidate to receive molnupiravir if his COVID test is positive.  Discussed viral etiology with the patient and the duration of symptoms.  Patient verbalizes understanding.  All questions were answered.  Patient stable for discharge.  Work note was provided. Final Clinical Impressions(s) / UC Diagnoses   Final diagnoses:  Exposure to COVID-19 virus  Viral illness     Discharge Instructions      COVID/flu test is pending. You will be contacted if the test results are positive. If the COVID test is positive, you are a candidate to receive molnupiravir. May continue the over-the-counter cough and cold medication that you are currently taking. Recommend a brat diet to include bananas, rice, applesauce, and toast while symptoms persist. Warm salt water gargles 3-4 times daily while symptoms persist. As discussed, recommend taking ibuprofen 600 to 800 mg every 8 hours as needed for generalized body aches or discomfort. A viral illness can last anywhere from 10 to 14 days.  Please be advised that if you continue to have symptoms during this time, that is not unusual.  However, if symptoms suddenly worsen, or you develop new symptoms or have other concerns, please follow-up in this clinic or with your primary care physician. Follow-up as needed.     ED Prescriptions   None    PDMP not reviewed this encounter.    Abran Cantor, NP 12/26/21 1432

## 2021-12-26 NOTE — Discharge Instructions (Addendum)
COVID/flu test is pending. You will be contacted if the test results are positive. If the COVID test is positive, you are a candidate to receive molnupiravir. May continue the over-the-counter cough and cold medication that you are currently taking. Recommend a brat diet to include bananas, rice, applesauce, and toast while symptoms persist. Warm salt water gargles 3-4 times daily while symptoms persist. As discussed, recommend taking ibuprofen 600 to 800 mg every 8 hours as needed for generalized body aches or discomfort. A viral illness can last anywhere from 10 to 14 days.  Please be advised that if you continue to have symptoms during this time, that is not unusual.  However, if symptoms suddenly worsen, or you develop new symptoms or have other concerns, please follow-up in this clinic or with your primary care physician. Follow-up as needed.

## 2021-12-26 NOTE — ED Triage Notes (Signed)
Fever, body aches, dizzy, headache, nauseated and vomiting x 1 that started today.

## 2022-04-13 ENCOUNTER — Encounter (HOSPITAL_COMMUNITY): Payer: Self-pay | Admitting: Emergency Medicine

## 2022-04-13 ENCOUNTER — Emergency Department (HOSPITAL_COMMUNITY): Payer: BC Managed Care – PPO

## 2022-04-13 ENCOUNTER — Other Ambulatory Visit: Payer: Self-pay

## 2022-04-13 ENCOUNTER — Emergency Department (HOSPITAL_COMMUNITY)
Admission: EM | Admit: 2022-04-13 | Discharge: 2022-04-13 | Disposition: A | Discharge status: Home / Self Care | Payer: BC Managed Care – PPO | Attending: Emergency Medicine | Admitting: Emergency Medicine

## 2022-04-13 DIAGNOSIS — R0789 Other chest pain: Secondary | ICD-10-CM | POA: Diagnosis not present

## 2022-04-13 DIAGNOSIS — B20 Human immunodeficiency virus [HIV] disease: Secondary | ICD-10-CM | POA: Diagnosis not present

## 2022-04-13 DIAGNOSIS — Z79899 Other long term (current) drug therapy: Secondary | ICD-10-CM | POA: Insufficient documentation

## 2022-04-13 DIAGNOSIS — I10 Essential (primary) hypertension: Secondary | ICD-10-CM | POA: Diagnosis not present

## 2022-04-13 DIAGNOSIS — R079 Chest pain, unspecified: Secondary | ICD-10-CM | POA: Diagnosis present

## 2022-04-13 LAB — BASIC METABOLIC PANEL
Anion gap: 7 (ref 5–15)
BUN: 11 mg/dL (ref 6–20)
CO2: 23 mmol/L (ref 22–32)
Calcium: 8.5 mg/dL — ABNORMAL LOW (ref 8.9–10.3)
Chloride: 104 mmol/L (ref 98–111)
Creatinine, Ser: 0.96 mg/dL (ref 0.61–1.24)
GFR, Estimated: >60 mL/min (ref >60)
Glucose, Bld: 106 mg/dL — ABNORMAL HIGH (ref 70–99)
Potassium: 3.6 mmol/L (ref 3.5–5.1)
Sodium: 134 mmol/L — ABNORMAL LOW (ref 135–145)

## 2022-04-13 LAB — HEPATIC FUNCTION PANEL
ALT: 26 U/L (ref 0–44)
AST: 25 U/L (ref 15–41)
Albumin: 4 g/dL (ref 3.5–5.0)
Alkaline Phosphatase: 72 U/L (ref 38–126)
Bilirubin, Direct: 0.1 mg/dL (ref 0.0–0.2)
Indirect Bilirubin: 0.5 mg/dL (ref 0.3–0.9)
Total Bilirubin: 0.6 mg/dL (ref 0.3–1.2)
Total Protein: 7 g/dL (ref 6.5–8.1)

## 2022-04-13 LAB — CBC
HCT: 40.6 % (ref 39.0–52.0)
Hemoglobin: 14.4 g/dL (ref 13.0–17.0)
MCH: 32.4 pg (ref 26.0–34.0)
MCHC: 35.5 g/dL (ref 30.0–36.0)
MCV: 91.2 fL (ref 80.0–100.0)
Platelets: 204 10*3/uL (ref 150–400)
RBC: 4.45 MIL/uL (ref 4.22–5.81)
RDW: 12.3 % (ref 11.5–15.5)
WBC: 5.9 10*3/uL (ref 4.0–10.5)
nRBC: 0 % (ref 0.0–0.2)

## 2022-04-13 LAB — TROPONIN I (HIGH SENSITIVITY)
Troponin I (High Sensitivity): 3 ng/L (ref <18)
Troponin I (High Sensitivity): 3 ng/L (ref <18)

## 2022-04-13 LAB — D-DIMER, QUANTITATIVE: D-Dimer, Quant: <0.27 ug/mL-FEU (ref 0.00–0.50)

## 2022-04-13 NOTE — ED Notes (Signed)
Pt to xr 

## 2022-04-13 NOTE — ED Provider Notes (Signed)
[facility] Provider Note   CSN: [id number] Arrival date & time: 04/13/22  2011     History {Add pertinent medical, surgical, social history, OB history to HPI:1} Chief Complaint  Patient presents with   Chest Pain   Shortness of Breath   Pain in arm    Ralph [name] is a 32 y.o. male.  Patient states that he has been having some pain in his left arm and left chest.  Patient has a history of positive HIV hypertension  The history is provided by the patient and medical records. No language interpreter was used.  Chest Pain Pain location:  L chest Pain quality: aching   Pain radiates to:  Does not radiate Pain severity:  Moderate Onset quality:  Sudden Timing:  Constant Progression:  Worsening Chronicity:  New Context: not breathing   Relieved by:  Nothing Worsened by:  Nothing Associated symptoms: no abdominal pain, no back pain, no cough, no fatigue and no headache   Shortness of Breath Associated symptoms: chest pain   Associated symptoms: no abdominal pain, no cough, no headaches and no rash        Home Medications Prior to Admission medications   Medication Sig Start Date End Date Taking? Authorizing Provider  bictegravir-emtricitabine-tenofovir AF (BIKTARVY) 50-200-25 MG TABS tablet Take 1 tablet by mouth daily. 06/19/21   Comer, Okey Regal, MD  docusate sodium (COLACE) 100 MG capsule Take 1 capsule (100 mg total) by mouth daily as needed for mild constipation. 10/07/21   Harlow Asa, MD  FLUoxetine (PROZAC) 10 MG capsule Take 1 capsule (10 mg total) by mouth daily. 10/07/21   Harlow Asa, MD  gabapentin (NEURONTIN) 100 MG capsule Take 1 capsule (100 mg total) by mouth 3 (three) times daily. 10/07/21   Harlow Asa, MD  hydrOXYzine (ATARAX) 25 MG tablet Take 1 tablet (25 mg total) by mouth every 6 (six) hours as needed for anxiety. 10/07/21   Harlow Asa, MD  losartan (COZAAR) 50 MG tablet Take 50 mg by  mouth daily. 09/22/21   [provider]  Multiple Vitamin (MULTIVITAMIN) tablet Take 1 tablet by mouth daily.    [provider]  nicotine (NICODERM CQ - DOSED IN MG/24 HOURS) 14 mg/24hr patch Place 1 patch (14 mg total) onto the skin daily as needed (smoking cessation). 10/07/21   Harlow Asa, MD      Allergies    Patient has no known allergies.    Review of Systems   Review of Systems  Constitutional:  Negative for appetite change and fatigue.  HENT:  Negative for congestion, ear discharge and sinus pressure.   Eyes:  Negative for discharge.  Respiratory:  Negative for cough.   Cardiovascular:  Positive for chest pain.  Gastrointestinal:  Negative for abdominal pain and diarrhea.  Genitourinary:  Negative for frequency and hematuria.  Musculoskeletal:  Negative for back pain.  Skin:  Negative for rash.  Neurological:  Negative for seizures and headaches.  Psychiatric/Behavioral:  Negative for hallucinations.     Physical Exam Updated Vital Signs BP 129/82   Pulse 61   Temp 98.3 F (36.8 C) (Oral)   Resp (!) 22   Ht 5\' 8"  (1.727 m)   Wt 86.2 kg   SpO2 98%   BMI 28.89 kg/m  Physical Exam Vitals and nursing note reviewed.  Constitutional:      Appearance: He is well-developed.  HENT:     Head:  Normocephalic.     Mouth/Throat:     Mouth: Mucous membranes are moist.  Eyes:     General: No scleral icterus.    Conjunctiva/sclera: Conjunctivae normal.  Neck:     Thyroid: No thyromegaly.  Cardiovascular:     Rate and Rhythm: Normal rate and regular rhythm.     Heart sounds: No murmur heard.    No friction rub. No gallop.  Pulmonary:     Breath sounds: No stridor. No wheezing or rales.  Chest:     Chest wall: Tenderness present.  Abdominal:     General: There is no distension.     Tenderness: There is no abdominal tenderness. There is no rebound.  Musculoskeletal:        General: Normal range of motion.     Cervical back: Neck supple.   Lymphadenopathy:     Cervical: No cervical adenopathy.  Skin:    Findings: No erythema or rash.  Neurological:     Mental Status: He is alert and oriented to person, place, and time.     Motor: No abnormal muscle tone.     Coordination: Coordination normal.  Psychiatric:        Behavior: Behavior normal.     ED Results / Procedures / Treatments   Labs (all labs ordered are listed, but only abnormal results are displayed) Labs Reviewed  BASIC METABOLIC PANEL - Abnormal; Notable for the following components:      Result Value   Sodium 134 (*)    Glucose, Bld 106 (*)    Calcium 8.5 (*)    All other components within normal limits  CBC  HEPATIC FUNCTION PANEL  D-DIMER, QUANTITATIVE  TROPONIN I (HIGH SENSITIVITY)  TROPONIN I (HIGH SENSITIVITY)    EKG EKG Interpretation  Date/Time:  Tuesday April 13 2022 20:27:26 EDT Ventricular Rate:  77 PR Interval:  131 QRS Duration: 108 QT Interval:  421 QTC Calculation: 477 R Axis:   94 Text Interpretation: Sinus rhythm Borderline right axis deviation Borderline T wave abnormalities Borderline prolonged QT interval Baseline wander in lead(s) II III aVF Confirmed by Milton Ferguson [phone]) on 04/13/2022 10:58:32 PM  Radiology DG Chest 2 View  Result Date: 04/13/2022 CLINICAL DATA:  Mild chest pain for few weeks that became sharp today. Numbness of left arm and mild pain to left shoulder for a few weeks. EXAM: CHEST - 2 VIEW COMPARISON:  06/08/2020. FINDINGS: The heart size and mediastinal contours are within normal limits. Both lungs are clear. No acute osseous abnormality. IMPRESSION: No active cardiopulmonary disease. Electronically Signed   By: Brett Fairy M.D.   On: 04/13/2022 20:52    Procedures Procedures  {Document cardiac monitor, telemetry assessment procedure when appropriate:1}  Medications Ordered in ED Medications - No data to display  ED Course/ Medical Decision Making/ A&P   {   Click here for ABCD2, HEART and  other calculatorsREFRESH Note before signing :1}                          Medical Decision Making Amount and/or Complexity of Data Reviewed Labs: ordered. Radiology: ordered.    Patient with atypical chest discomfort.  Labs unremarkable.  Patient will take Tylenol Motrin and follow-up with PCP {Document critical care time when appropriate:1} {Document review of labs and clinical decision tools ie heart score, Chads2Vasc2 etc:1}  {Document your independent review of radiology images, and any outside records:1} {Document your discussion with family members, caretakers, and  with consultants:1} {Document social determinants of health affecting pt's care:1} {Document your decision making why or why not admission, treatments were needed:1} Final Clinical Impression(s) / ED Diagnoses Final diagnoses:  Atypical chest pain    Rx / DC Orders ED Discharge Orders     None

## 2022-04-13 NOTE — ED Triage Notes (Signed)
Pt with c/o mild chest pain for a few weeks that became sharp today. Pt also c/o numbness of L arm and mild pain to same arm as well as pain to L shoulder for a few weeks.

## 2022-04-13 NOTE — Discharge Instructions (Signed)
Take Tylenol or Motrin for pain and follow-up with your family doctor if any problems ?

## 2022-06-18 ENCOUNTER — Other Ambulatory Visit: Payer: Self-pay

## 2022-06-18 ENCOUNTER — Ambulatory Visit: Payer: BC Managed Care – PPO | Admitting: Internal Medicine

## 2022-06-18 ENCOUNTER — Encounter: Payer: Self-pay | Admitting: Internal Medicine

## 2022-06-18 VITALS — BP 131/82 | HR 90 | Temp 97.0°F | Ht 68.0 in | Wt 185.0 lb

## 2022-06-18 DIAGNOSIS — B20 Human immunodeficiency virus [HIV] disease: Secondary | ICD-10-CM | POA: Diagnosis not present

## 2022-06-18 DIAGNOSIS — F332 Major depressive disorder, recurrent severe without psychotic features: Secondary | ICD-10-CM

## 2022-06-18 NOTE — Assessment & Plan Note (Addendum)
He continues in counseling and getting treatment and doing well overall.  Continue with counseling.

## 2022-06-18 NOTE — Progress Notes (Signed)
   Subjective:    Patient ID: Ralph [name], male    DOB: 11/10/1990, 32 y.o.   MRN: [id number]  HPI Ralph [name] is here for follow up of HIV He continues on [place] with no missed doses.  No issues with getting, taking or tolerating his medication.  No complaints.  No sexual activity.    Review of Systems  Constitutional:  Negative for fatigue.  Gastrointestinal:  Negative for diarrhea.  Skin:  Negative for rash.       Objective:   Physical Exam Eyes:     General: No scleral icterus. Pulmonary:     Effort: Pulmonary effort is normal.  Neurological:     Mental Status: He is alert.   SH: no tobacco use        Assessment & Plan:

## 2022-06-18 NOTE — Assessment & Plan Note (Addendum)
He is doing well and no changes indicated.  Refills sent.  Previous labs reviewed with him.   Will check his labs today to assure continued good compliance and viral suppression.  He can otherwise rtc in 6 months.

## 2022-06-18 NOTE — Addendum Note (Signed)
Addended by: Gardiner Barefoot on: 06/18/2022 10:37 AM   Modules accepted: Orders

## 2022-06-21 LAB — T-HELPER CELLS (CD4) COUNT (NOT AT ARMC)
Absolute CD4: 893 cells/uL (ref 490–1740)
CD4 T Helper %: 45 % (ref 30–61)
Total lymphocyte count: 1991 cells/uL (ref 850–3900)

## 2022-06-21 LAB — HIV-1 RNA QUANT-NO REFLEX-BLD
HIV 1 RNA Quant: <20 Copies/mL — ABNORMAL HIGH
HIV-1 RNA Quant, Log: <1.3 Log cps/mL — ABNORMAL HIGH

## 2022-06-29 ENCOUNTER — Other Ambulatory Visit: Payer: Self-pay

## 2022-06-29 DIAGNOSIS — B20 Human immunodeficiency virus [HIV] disease: Secondary | ICD-10-CM

## 2022-06-29 MED ORDER — BIKTARVY 50-200-25 MG PO TABS: 1.0000 | ORAL_TABLET | Freq: Every day | ORAL | 5 refills | Status: DC

## 2022-12-08 ENCOUNTER — Other Ambulatory Visit (HOSPITAL_COMMUNITY)
Admission: RE | Admit: 2022-12-08 | Discharge: 2022-12-08 | Disposition: A | Discharge status: Home / Self Care | Payer: BC Managed Care – PPO | Source: Ambulatory Visit | Attending: Internal Medicine | Admitting: Internal Medicine

## 2022-12-08 ENCOUNTER — Encounter: Payer: Self-pay | Admitting: Internal Medicine

## 2022-12-08 ENCOUNTER — Ambulatory Visit: Payer: BC Managed Care – PPO | Admitting: Internal Medicine

## 2022-12-08 ENCOUNTER — Other Ambulatory Visit: Payer: Self-pay

## 2022-12-08 VITALS — BP 137/93 | HR 83 | Resp 16 | Ht 68.0 in | Wt 186.0 lb

## 2022-12-08 DIAGNOSIS — Z113 Encounter for screening for infections with a predominantly sexual mode of transmission: Secondary | ICD-10-CM | POA: Diagnosis present

## 2022-12-08 DIAGNOSIS — Z23 Encounter for immunization: Secondary | ICD-10-CM | POA: Diagnosis not present

## 2022-12-08 DIAGNOSIS — F17290 Nicotine dependence, other tobacco product, uncomplicated: Secondary | ICD-10-CM

## 2022-12-08 DIAGNOSIS — B20 Human immunodeficiency virus [HIV] disease: Secondary | ICD-10-CM

## 2022-12-08 DIAGNOSIS — Z79899 Other long term (current) drug therapy: Secondary | ICD-10-CM

## 2022-12-08 DIAGNOSIS — Z720 Tobacco use: Secondary | ICD-10-CM

## 2022-12-08 NOTE — Progress Notes (Signed)
History of Present Illness The patient, with a history of HIV, reports doing well on Biktarvy with an undetectable viral load. He has been adherent to the medication and has not experienced any problems. He is due for a flu shot and expresses a willingness to receive it, recalling a week-long bout of flu during the previous year. The patient has been sexually active but has been using protection. The patient has a history of smoking and has transitioned to vaping, which he acknowledges is causing an increase in his blood pressure. He expresses a desire to quit vaping but anticipates the challenge of withdrawal.  Medications - Biktarvy  Social History - Previous smoker - Currently vaping - Kayaking and rowing activities  Physical Exam He is alert, nad normal respiratory effort  Assessment & Plan HIV Undetectable viral load on Biktarvy with no reported side effects. -Continue Biktarvy as prescribed. -Draw blood today for routine viral load and immune system check.  Sexual Health Patient reports being sexually active with consistent condom use. -Perform routine STI screening today including swabs, urine, and blood tests.  Tobacco Use Patient reports vaping, experiencing headaches and elevated blood pressure. Discussed the risks of continued use including stroke. -Strongly advised to quit vaping.  Influenza Vaccination Patient had flu last year and agrees to vaccination today. -Administer flu shot today.  Weight Gain Patient reports recent weight gain and decreased gym attendance. -Encouraged moderation in diet and resumption of regular exercise, such as kayaking and paddle boarding.  Follow-up Plan to review results of today's tests and monitor progress on quitting vaping.

## 2022-12-09 LAB — T-HELPER CELL (CD4) - (RCID CLINIC ONLY)
CD4 % Helper T Cell: 46 % (ref 33–65)
CD4 T Cell Abs: 757 /uL (ref 400–1790)

## 2022-12-09 LAB — CYTOLOGY, (ORAL, ANAL, URETHRAL) ANCILLARY ONLY
Chlamydia: NEGATIVE
Chlamydia: NEGATIVE
Comment: NEGATIVE
Comment: NEGATIVE
Comment: NORMAL
Comment: NORMAL
Neisseria Gonorrhea: NEGATIVE
Neisseria Gonorrhea: NEGATIVE

## 2022-12-09 LAB — URINE CYTOLOGY ANCILLARY ONLY
Chlamydia: NEGATIVE
Comment: NEGATIVE
Comment: NORMAL
Neisseria Gonorrhea: NEGATIVE

## 2022-12-11 LAB — LIPID PANEL
Cholesterol: 248 mg/dL — ABNORMAL HIGH (ref <200)
HDL: 54 mg/dL (ref >=40)
LDL Cholesterol (Calc): 156 mg/dL — ABNORMAL HIGH
Non-HDL Cholesterol (Calc): 194 mg/dL — ABNORMAL HIGH (ref <130)
Total CHOL/HDL Ratio: 4.6 (calc) (ref <5.0)
Triglycerides: 211 mg/dL — ABNORMAL HIGH (ref <150)

## 2022-12-11 LAB — RPR: RPR Ser Ql: NONREACTIVE

## 2022-12-11 LAB — HIV-1 RNA QUANT-NO REFLEX-BLD
HIV 1 RNA Quant: NOT DETECTED {copies}/mL
HIV-1 RNA Quant, Log: NOT DETECTED {Log_copies}/mL

## 2023-01-28 ENCOUNTER — Other Ambulatory Visit: Payer: Self-pay | Admitting: Internal Medicine

## 2023-01-28 DIAGNOSIS — B20 Human immunodeficiency virus [HIV] disease: Secondary | ICD-10-CM

## 2023-06-07 ENCOUNTER — Ambulatory Visit: Payer: BC Managed Care – PPO | Admitting: Internal Medicine

## 2023-06-10 ENCOUNTER — Ambulatory Visit: Admitting: Infectious Diseases

## 2023-06-27 ENCOUNTER — Telehealth: Payer: Self-pay

## 2023-06-27 NOTE — Telephone Encounter (Signed)
 Received medical release from [facility] requesting records for the last year. Patient currently incarcerated @ [facility]. Release placed in medical records box. Julien Odor, RMA

## 2023-08-08 ENCOUNTER — Encounter: Payer: Self-pay | Admitting: Infectious Diseases

## 2023-08-08 ENCOUNTER — Other Ambulatory Visit: Payer: Self-pay

## 2023-08-08 ENCOUNTER — Ambulatory Visit: Payer: Self-pay | Admitting: Infectious Diseases

## 2023-08-08 VITALS — BP 131/87 | HR 83 | Temp 97.8°F | Ht 68.0 in | Wt 197.6 lb

## 2023-08-08 DIAGNOSIS — B20 Human immunodeficiency virus [HIV] disease: Secondary | ICD-10-CM

## 2023-08-08 DIAGNOSIS — Z23 Encounter for immunization: Secondary | ICD-10-CM

## 2023-08-08 MED ORDER — BIKTARVY 50-200-25 MG PO TABS: 1.0000 | ORAL_TABLET | Freq: Every day | ORAL | 5 refills | Status: DC

## 2023-08-08 NOTE — Patient Instructions (Signed)
 Nice to meet you!   Will send in refills of your biktarvy    Blood pressure looks great!   See you sometime between November and January - scheduled to your preference then we can time it again every 6 months

## 2023-08-08 NOTE — Progress Notes (Signed)
 Name: Ralph [name]  DOB: 31-Jul-1990 MRN: [id number] PCP: Starla Stapler, FNP    Brief Narrative:  [name] is a 33 y.o. male with HIV, Stage 2, diagnosed on July 2018. CD4 nadir 290 VL 14,700 copies  Transmission Risk: MSM History of OIs: none History of STIs: syphilis  Hep B sAg (-), sAb (vaccinated 2019), cAb (-); Hep A (vaccinated 2019), Hep C (- 2018) Quantiferon (- 2018) HLA B*5701 () G6PD: () MMR Titer Pending   Previous Regimens: Biktarvy  2018  Genotypes: Wildtype 2018  Subjective  Chief Complaint  Patient presents with   Follow-up     Discussed the use of AI scribe software for clinical note transcription with the patient, who gave verbal consent to proceed.  History of Present Illness        08/08/2023    3:49 PM  Depression screen PHQ 2/9  Decreased Interest 0  Down, Depressed, Hopeless 0  PHQ - 2 Score 0  Altered sleeping 0  Tired, decreased energy 0  Change in appetite 0  Feeling bad or failure about yourself  0  Trouble concentrating 0  Moving slowly or fidgety/restless 0  Suicidal thoughts 0  PHQ-9 Score 0  Difficult doing work/chores Not difficult at all    Review of Systems  Constitutional:  Negative for chills and fever.  HENT:  Negative for tinnitus.   Eyes:  Negative for blurred vision and photophobia.  Respiratory:  Negative for cough and sputum production.   Cardiovascular:  Negative for chest pain.  Gastrointestinal:  Negative for diarrhea, nausea and vomiting.  Genitourinary:  Negative for dysuria.  Skin:  Negative for rash.  Neurological:  Negative for headaches.    Past Medical History:  Diagnosis Date   Anxiety    Depression    HIV (human immunodeficiency virus infection) (HCC)    Hypertension     Outpatient Medications Prior to Visit  Medication Sig Dispense Refill   amphetamine -dextroamphetamine  (ADDERALL) 20 MG tablet Take 20 mg by mouth 2 (two) times daily.     losartan  (COZAAR ) 50 MG tablet  Take 50 mg by mouth daily.     sertraline (ZOLOFT) 100 MG tablet Take 100 mg by mouth daily.     BIKTARVY  50-200-25 MG TABS tablet TAKE 1 TABLET BY MOUTH ONCE DAILY 30 tablet 5   sertraline (ZOLOFT) 50 MG tablet Take 50 mg by mouth daily.     docusate sodium  (COLACE) 100 MG capsule Take 1 capsule (100 mg total) by mouth daily as needed for mild constipation. (Patient not taking: Reported on 08/08/2023) 10 capsule 0   Multiple Vitamin (MULTIVITAMIN) tablet Take 1 tablet by mouth daily. (Patient not taking: Reported on 08/08/2023)     gabapentin  (NEURONTIN ) 100 MG capsule Take 1 capsule (100 mg total) by mouth 3 (three) times daily. (Patient not taking: Reported on 08/08/2023) 90 capsule 0   hydrOXYzine  (ATARAX ) 25 MG tablet Take 1 tablet (25 mg total) by mouth every 6 (six) hours as needed for anxiety. (Patient not taking: Reported on 08/08/2023) 30 tablet 0   No facility-administered medications prior to visit.     No Known Allergies  Social History   Tobacco Use   Smoking status: Former    Types: E-cigarettes   Smokeless tobacco: Never   Tobacco comments:    States he quit 3 months ago (06/19/21)  Substance Use Topics   Alcohol use: Yes    Comment: occassional   Drug use: No    No family  history on file.  Social History   Substance and Sexual Activity  Sexual Activity Yes   Partners: Male   Birth control/protection: None   Comment: condoms declined      Objective  Vitals:   08/08/23 1551  BP: 131/87  Pulse: 83  Temp: 97.8 F (36.6 C)  TempSrc: Oral  SpO2: 97%  Weight: 197 lb 9.6 oz (89.6 kg)  Height: 5' 8 (1.727 m)   Body mass index is 30.04 kg/m.  Physical Exam Constitutional:      Appearance: Normal appearance. He is not ill-appearing.  HENT:     Head: Normocephalic.     Mouth/Throat:     Mouth: Mucous membranes are moist.     Pharynx: Oropharynx is clear.  Eyes:     General: No scleral icterus. Cardiovascular:     Rate and Rhythm: Normal rate.   Pulmonary:     Effort: Pulmonary effort is normal.  Musculoskeletal:        General: Normal range of motion.     Cervical back: Normal range of motion.  Skin:    Coloration: Skin is not jaundiced or pale.  Neurological:     Mental Status: He is alert and oriented to person, place, and time.  Psychiatric:        Mood and Affect: Mood normal.        Judgment: Judgment normal.        Assessment and Plan    HIV infection - HIV infection is well-controlled with Biktarvy  since 2018. Viral load remains undetectable, and CD4 count is 757, indicating a strong immune system. No side effects from Biktarvy  reported. Statin screening at 33 yo. Anal cancer screening at 33 yo. Vaccines updated today.  - Order blood work to check for measles immunity. - HIV RNA and CD4 today  - offered / declined STI testing nee - Administer meningitis booster vaccine.  Hypertension - Hypertension is well-controlled with losartan . Managed with PCP   Depression / PTSD / Anxiety -  Well managed with sertraline, effectively improving mood and social interactions. Currently on 100 mg dosage. Enjoys outdoor activities as well which helps       Orders Placed This Encounter  Procedures   HIV 1 RNA quant-no reflex-bld   T-helper cells (CD4) count   Measles/Mumps/Rubella Immunity    Meds ordered this encounter  Medications   bictegravir-emtricitabine -tenofovir  AF (BIKTARVY ) 50-200-25 MG TABS tablet    Sig: Take 1 tablet by mouth daily.    Dispense:  30 tablet    Refill:  5    Prescription Type::   Renewal    Return in about 6 months (around 02/08/2024).   [name], MSN, NP-C [facility]  [email or web address] Pager: [phone] Office: [phone] [facility] Main Line: [phone] *Secure Chat Communication Welcome

## 2023-08-08 NOTE — Addendum Note (Signed)
 Addended by: MORGAN LEVORA RAMAN on: 08/08/2023 04:43 PM   Modules accepted: Orders

## 2023-08-10 ENCOUNTER — Ambulatory Visit: Payer: Self-pay | Admitting: Infectious Diseases

## 2023-08-10 LAB — HIV-1 RNA QUANT-NO REFLEX-BLD
HIV 1 RNA Quant: NOT DETECTED {copies}/mL
HIV-1 RNA Quant, Log: NOT DETECTED {Log_copies}/mL

## 2023-08-10 LAB — T-HELPER CELLS (CD4) COUNT (NOT AT ARMC)
CD4 % Helper T Cell: 48 % (ref 33–65)
CD4 T Cell Abs: 832 /uL (ref 400–1790)

## 2023-08-10 LAB — MEASLES/MUMPS/RUBELLA IMMUNITY
Mumps IgG: <9 [AU]/ml — ABNORMAL LOW
Rubella: 1.56 {index}
Rubeola IgG: <13.5 [AU]/ml — ABNORMAL LOW

## 2023-09-15 ENCOUNTER — Other Ambulatory Visit: Payer: Self-pay

## 2023-09-15 DIAGNOSIS — B20 Human immunodeficiency virus [HIV] disease: Secondary | ICD-10-CM

## 2023-09-15 MED ORDER — BIKTARVY 50-200-25 MG PO TABS: 1.0000 | ORAL_TABLET | Freq: Every day | ORAL | 5 refills | Status: DC

## 2023-09-15 NOTE — Progress Notes (Signed)
 Pharmacy updated to  [facility] order pharmacy location in [place], [place]  [address], [place]

## 2023-09-21 ENCOUNTER — Other Ambulatory Visit: Payer: Self-pay

## 2023-09-21 DIAGNOSIS — B20 Human immunodeficiency virus [HIV] disease: Secondary | ICD-10-CM

## 2023-09-21 MED ORDER — BIKTARVY 50-200-25 MG PO TABS: 1.0000 | ORAL_TABLET | Freq: Every day | ORAL | 5 refills | Status: DC

## 2023-09-27 ENCOUNTER — Other Ambulatory Visit: Payer: Self-pay

## 2023-09-27 ENCOUNTER — Emergency Department (HOSPITAL_COMMUNITY)

## 2023-09-27 ENCOUNTER — Emergency Department (HOSPITAL_COMMUNITY)
Admission: EM | Admit: 2023-09-27 | Discharge: 2023-09-27 | Disposition: A | Discharge status: Home / Self Care | Source: Ambulatory Visit | Attending: Emergency Medicine | Admitting: Emergency Medicine

## 2023-09-27 ENCOUNTER — Encounter (HOSPITAL_COMMUNITY): Payer: Self-pay

## 2023-09-27 DIAGNOSIS — B349 Viral infection, unspecified: Secondary | ICD-10-CM | POA: Insufficient documentation

## 2023-09-27 DIAGNOSIS — R509 Fever, unspecified: Secondary | ICD-10-CM | POA: Diagnosis present

## 2023-09-27 LAB — CBC WITH DIFFERENTIAL/PLATELET
Abs Immature Granulocytes: 0.07 K/uL (ref 0.00–0.07)
Basophils Absolute: 0.1 K/uL (ref 0.0–0.1)
Basophils Relative: 0 %
Eosinophils Absolute: 0.2 K/uL (ref 0.0–0.5)
Eosinophils Relative: 1 %
HCT: 41.3 % (ref 39.0–52.0)
Hemoglobin: 14.7 g/dL (ref 13.0–17.0)
Immature Granulocytes: 1 %
Lymphocytes Relative: 10 %
Lymphs Abs: 1.5 K/uL (ref 0.7–4.0)
MCH: 33.3 pg (ref 26.0–34.0)
MCHC: 35.6 g/dL (ref 30.0–36.0)
MCV: 93.4 fL (ref 80.0–100.0)
Monocytes Absolute: 1.1 K/uL — ABNORMAL HIGH (ref 0.1–1.0)
Monocytes Relative: 7 %
Neutro Abs: 12.5 K/uL — ABNORMAL HIGH (ref 1.7–7.7)
Neutrophils Relative %: 81 %
Platelets: 177 K/uL (ref 150–400)
RBC: 4.42 MIL/uL (ref 4.22–5.81)
RDW: 11.8 % (ref 11.5–15.5)
WBC: 15.3 K/uL — ABNORMAL HIGH (ref 4.0–10.5)
nRBC: 0 % (ref 0.0–0.2)

## 2023-09-27 LAB — COMPREHENSIVE METABOLIC PANEL WITH GFR
ALT: 23 U/L (ref 0–44)
AST: 19 U/L (ref 15–41)
Albumin: 3.9 g/dL (ref 3.5–5.0)
Alkaline Phosphatase: 71 U/L (ref 38–126)
Anion gap: 11 (ref 5–15)
BUN: 9 mg/dL (ref 6–20)
CO2: 23 mmol/L (ref 22–32)
Calcium: 9.2 mg/dL (ref 8.9–10.3)
Chloride: 102 mmol/L (ref 98–111)
Creatinine, Ser: 0.95 mg/dL (ref 0.61–1.24)
GFR, Estimated: >60 mL/min (ref >60)
Glucose, Bld: 93 mg/dL (ref 70–99)
Potassium: 4 mmol/L (ref 3.5–5.1)
Sodium: 136 mmol/L (ref 135–145)
Total Bilirubin: 1 mg/dL (ref 0.0–1.2)
Total Protein: 7.9 g/dL (ref 6.5–8.1)

## 2023-09-27 LAB — GROUP A STREP BY PCR: Group A Strep by PCR: NOT DETECTED

## 2023-09-27 LAB — RESP PANEL BY RT-PCR (RSV, FLU A&B, COVID)  RVPGX2
Influenza A by PCR: NEGATIVE
Influenza B by PCR: NEGATIVE
Resp Syncytial Virus by PCR: NEGATIVE
SARS Coronavirus 2 by RT PCR: NEGATIVE

## 2023-09-27 MED ORDER — FLUTICASONE PROPIONATE 50 MCG/ACT NA SUSP: 2.0000 | Freq: Every day | NASAL | 0 refills | Status: AC

## 2023-09-27 MED ORDER — SODIUM CHLORIDE 0.9 % IV BOLUS
1000.0000 mL | Freq: Once | INTRAVENOUS | Status: AC
  Administered 2023-09-27: 1000 mL via INTRAVENOUS

## 2023-09-27 MED ORDER — KETOROLAC TROMETHAMINE 15 MG/ML IJ SOLN
15.0000 mg | Freq: Once | INTRAMUSCULAR | Status: AC
  Administered 2023-09-27: 15 mg via INTRAVENOUS
  Filled 2023-09-27: qty 1

## 2023-09-27 MED ORDER — ONDANSETRON HCL 4 MG/2ML IJ SOLN
4.0000 mg | Freq: Once | INTRAMUSCULAR | Status: AC
  Administered 2023-09-27: 4 mg via INTRAVENOUS
  Filled 2023-09-27: qty 2

## 2023-09-27 MED ORDER — PROMETHAZINE-DM 6.25-15 MG/5ML PO SYRP: 5.0000 mL | ORAL_SOLUTION | Freq: Four times a day (QID) | ORAL | 0 refills | Status: AC | PRN

## 2023-09-27 NOTE — ED Provider Notes (Signed)
 [place] EMERGENCY DEPARTMENT AT [facility] Provider Note   CSN: [id number] Arrival date & time: 09/27/23  1224     Patient presents with: Fever   Ralph [name] is a 33 y.o. male.   Patient is a 33 year old male who presents to the emergency department the chief complaint of fever, chills, cough, congestion, rhinorrhea, sore throat, headache, body aches which has been ongoing for approximate the past 2 days.  He does note that his partner has been sick with similar symptoms.  He notes that he has had no chest pain or shortness of breath.  He denies any abdominal pain, nausea, vomiting, diarrhea.  He denies any dizziness, lightheadedness or syncope.   Fever Associated symptoms: cough and rhinorrhea        Prior to Admission medications   Medication Sig Start Date End Date Taking? Authorizing Provider  fluticasone  (FLONASE ) 50 MCG/ACT nasal spray Place 2 sprays into both nostrils daily. 09/27/23  Yes Daralene Bruckner D, PA-C  promethazine -dextromethorphan (PROMETHAZINE -DM) 6.25-15 MG/5ML syrup Take 5 mLs by mouth 4 (four) times daily as needed. 09/27/23  Yes Daralene Bruckner D, PA-C  amphetamine -dextroamphetamine  (ADDERALL) 20 MG tablet Take 20 mg by mouth 2 (two) times daily. 04/15/22   [provider]  bictegravir-emtricitabine -tenofovir  AF (BIKTARVY ) 50-200-25 MG TABS tablet Take 1 tablet by mouth daily. 09/21/23   Melvenia Corean SAILOR, NP  docusate sodium  (COLACE) 100 MG capsule Take 1 capsule (100 mg total) by mouth daily as needed for mild constipation. Patient not taking: Reported on 08/08/2023 10/07/21   Gerri Greig BRAVO, MD  losartan  (COZAAR ) 50 MG tablet Take 50 mg by mouth daily. 09/22/21   [provider]  Multiple Vitamin (MULTIVITAMIN) tablet Take 1 tablet by mouth daily. Patient not taking: Reported on 08/08/2023    [provider]  sertraline (ZOLOFT) 100 MG tablet Take 100 mg by mouth daily.    [provider]     Allergies: Patient has no known allergies.    Review of Systems  Constitutional:  Positive for fever.  HENT:  Positive for rhinorrhea.   Respiratory:  Positive for cough.   All other systems reviewed and are negative.   Updated Vital Signs BP 121/76   Pulse (!) 102   Temp 98.6 F (37 C) (Oral)   Resp 18   Ht 5' 8 (1.727 m)   Wt 89.6 kg   SpO2 98%   BMI 30.03 kg/m   Physical Exam Vitals and nursing note reviewed.  Constitutional:      General: He is not in acute distress.    Appearance: Normal appearance. He is not ill-appearing.  HENT:     Head: Normocephalic and atraumatic.     Nose: Nose normal.     Mouth/Throat:     Mouth: Mucous membranes are moist.  Eyes:     Extraocular Movements: Extraocular movements intact.     Conjunctiva/sclera: Conjunctivae normal.     Pupils: Pupils are equal, round, and reactive to light.  Cardiovascular:     Rate and Rhythm: Normal rate and regular rhythm.     Pulses: Normal pulses.     Heart sounds: Normal heart sounds. No murmur heard.    No gallop.  Pulmonary:     Effort: Pulmonary effort is normal. No respiratory distress.     Breath sounds: Normal breath sounds. No stridor. No wheezing, rhonchi or rales.  Abdominal:     General: Abdomen is flat. Bowel sounds are normal. There is no distension.  Palpations: Abdomen is soft.     Tenderness: There is no abdominal tenderness. There is no guarding.  Musculoskeletal:        General: Normal range of motion.     Cervical back: Normal range of motion and neck supple. No rigidity or tenderness.     Right lower leg: No edema.     Left lower leg: No edema.  Skin:    General: Skin is warm and dry.     Findings: No bruising or rash.  Neurological:     General: No focal deficit present.     Mental Status: He is alert and oriented to person, place, and time. Mental status is at baseline.  Psychiatric:        Mood and Affect: Mood normal.        Behavior: Behavior normal.         Thought Content: Thought content normal.        Judgment: Judgment normal.     (all labs ordered are listed, but only abnormal results are displayed) Labs Reviewed  CBC WITH DIFFERENTIAL/PLATELET - Abnormal; Notable for the following components:      Result Value   WBC 15.3 (*)    Neutro Abs 12.5 (*)    Monocytes Absolute 1.1 (*)    All other components within normal limits  RESP PANEL BY RT-PCR (RSV, FLU A&B, COVID)  [id number]  GROUP A STREP BY PCR  COMPREHENSIVE METABOLIC PANEL WITH GFR    EKG: None  Radiology: DG Chest 2 View Result Date: 09/27/2023 EXAM: 2 VIEW(S) XRAY OF THE CHEST 09/27/2023 01:10:00 PM COMPARISON: 04/13/2022 CLINICAL HISTORY: Cough, fever. Per triage; Pt arrived via POV from home c/o cough, fever and cold-like symptoms that have been worsening since Sunday. Pt reports taking OTC medications with minimal relief. FINDINGS: LUNGS AND PLEURA: No focal pulmonary opacity. No pulmonary edema. No pleural effusion. No pneumothorax. HEART AND MEDIASTINUM: No acute abnormality of the cardiac and mediastinal silhouettes. BONES AND SOFT TISSUES: No acute osseous abnormality. IMPRESSION: 1. No acute process. Electronically signed by: Waddell Calk MD 09/27/2023 01:26 PM EDT RP Workstation: [id number]     Procedures   Medications Ordered in the ED  sodium chloride  0.9 % bolus 1,000 mL (0 mLs Intravenous Stopped 09/27/23 1457)  ketorolac  (TORADOL ) 15 MG/ML injection 15 mg (15 mg Intravenous Given 09/27/23 1334)  ondansetron  (ZOFRAN ) injection 4 mg (4 mg Intravenous Given 09/27/23 1333)                                    Medical Decision Making Amount and/or Complexity of Data Reviewed Labs: ordered. Radiology: ordered.  Risk Prescription drug management.   This patient presents to the ED for concern of cough, congestion, body aches, fever and chills differential diagnosis includes viral syndrome, strep, pneumonia, sepsis    Additional history obtained:  Additional  history obtained from none External records from outside source obtained and reviewed including none   Lab Tests:  I Ordered, and personally interpreted labs.  The pertinent results include:  Leukocytosis, no anemia, normal kidney function liver function, normal electrolytes, negative strep test, negative viral swab   Imaging Studies ordered:  I ordered imaging studies including chest x-ray I independently visualized and interpreted imaging which showed no acute cardiopulmonary process I agree with the radiologist interpretation   Medicines ordered and prescription drug management:  I ordered medication including Toradol , Zofran , IV fluids for  headache, body aches Reevaluation of the patient after these medicines showed that the patient improved I have reviewed the patients home medicines and have made adjustments as needed   Problem List / ED Course:  Patient is doing well at this time and is stable for discharge home.  Discussed with patient as her workup in the emergency department has been unremarkable.  He was negative for COVID-19, influenza, RSV, strep pharyngitis.  Chest x-ray demonstrated no indication for pneumonia.  Blood work has been unremarkable as well.  Vital signs are stable at this time and no indication for sepsis.  Patient is otherwise well-appearing at this point with no signs of acute distress.  Suspect an acute viral syndrome and will continue symptomatic treatment.  The need for close follow-up with primary care doctor was discussed as well as strict turn precautions for any new or worsening symptoms.  Patient voiced understanding and had no additional questions.   Social Determinants of Health:  None        Final diagnoses:  Acute viral syndrome    ED Discharge Orders          Ordered    promethazine -dextromethorphan (PROMETHAZINE -DM) 6.25-15 MG/5ML syrup  4 times daily PRN        09/27/23 1446    fluticasone  (FLONASE ) 50 MCG/ACT nasal spray   Daily        09/27/23 1446               Daralene Lonni BIRCH, PA-C 09/27/23 1542    Charlyn Sora, MD 09/27/23 1558

## 2023-09-27 NOTE — ED Notes (Signed)
 Patient transported to X-ray

## 2023-09-27 NOTE — Discharge Instructions (Signed)
 Please follow-up closely with your primary care doctor on an outpatient basis.  Return to emergency department immediately for any new or worsening symptoms.

## 2023-09-27 NOTE — ED Triage Notes (Signed)
 Pt arrived via POV from home c/o cough, fever and cold-like symptoms that have been worsening since Sunday. Pt reports taking OTC medications with minimal relief.

## 2023-09-30 ENCOUNTER — Other Ambulatory Visit: Payer: Self-pay | Admitting: Pharmacist

## 2023-09-30 DIAGNOSIS — B20 Human immunodeficiency virus [HIV] disease: Secondary | ICD-10-CM

## 2023-09-30 MED ORDER — BICTEGRAVIR-EMTRICITAB-TENOFOV 50-200-25 MG PO TABS: 1.0000 | ORAL_TABLET | Freq: Every day | ORAL | Status: AC

## 2023-09-30 NOTE — Progress Notes (Signed)
 Medication Samples have been provided to the patient.  Drug name: Biktarvy         Strength: 50/200/25 mg       Qty: 7 tablets (1 bottles) LOT: [id number]   Exp.Date: 7/27  Samples requested by Erika Haver.  Dosing instructions: Take one tablet by mouth once daily  The patient has been instructed regarding the correct time, dose, and frequency of taking this medication, including desired effects and most common side effects.   Nicklas Barns, PharmD, CPP, BCIDP, AAHIVP Clinical Pharmacist Practitioner Infectious Diseases Clinical Pharmacist [facility]

## 2023-11-17 ENCOUNTER — Other Ambulatory Visit: Payer: Self-pay

## 2023-11-17 DIAGNOSIS — B20 Human immunodeficiency virus [HIV] disease: Secondary | ICD-10-CM

## 2023-11-17 MED ORDER — BIKTARVY 50-200-25 MG PO TABS: 1.0000 | ORAL_TABLET | Freq: Every day | ORAL | 5 refills | Status: AC

## 2024-01-30 ENCOUNTER — Ambulatory Visit: Payer: Self-pay | Admitting: Infectious Diseases

## 2024-03-16 ENCOUNTER — Ambulatory Visit: Payer: Self-pay | Admitting: Infectious Diseases
